# Patient Record
Sex: Male | Born: 1985 | Race: Black or African American | Hispanic: No | Marital: Single | Smoking: Never smoker
Health system: Southern US, Community
[De-identification: ages and names within clinical notes are randomized; demographics above are authoritative.]

## PROBLEM LIST (undated history)

## (undated) DIAGNOSIS — J45909 Unspecified asthma, uncomplicated: Secondary | ICD-10-CM

## (undated) HISTORY — PX: APPENDECTOMY: SHX54

---
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
## 2007-05-20 ENCOUNTER — Observation Stay (HOSPITAL_COMMUNITY): Admission: EM | Admit: 2007-05-20 | Discharge: 2007-05-21 | Payer: Self-pay | Admitting: Emergency Medicine

## 2007-05-20 ENCOUNTER — Encounter (INDEPENDENT_AMBULATORY_CARE_PROVIDER_SITE_OTHER): Payer: Self-pay | Admitting: General Surgery

## 2007-08-28 ENCOUNTER — Emergency Department (HOSPITAL_COMMUNITY): Admission: EM | Admit: 2007-08-28 | Discharge: 2007-08-28 | Payer: Self-pay | Admitting: Emergency Medicine

## 2007-08-30 ENCOUNTER — Emergency Department (HOSPITAL_COMMUNITY): Admission: EM | Admit: 2007-08-30 | Discharge: 2007-08-30 | Payer: Self-pay | Admitting: Emergency Medicine

## 2009-02-03 ENCOUNTER — Emergency Department (HOSPITAL_COMMUNITY): Admission: EM | Admit: 2009-02-03 | Discharge: 2009-02-04 | Payer: Self-pay | Admitting: Emergency Medicine

## 2009-04-26 ENCOUNTER — Emergency Department (HOSPITAL_COMMUNITY): Admission: EM | Admit: 2009-04-26 | Discharge: 2009-04-26 | Payer: Self-pay | Admitting: Emergency Medicine

## 2009-08-03 ENCOUNTER — Emergency Department (HOSPITAL_COMMUNITY): Admission: EM | Admit: 2009-08-03 | Discharge: 2009-08-03 | Payer: Self-pay | Admitting: Emergency Medicine

## 2010-01-02 ENCOUNTER — Emergency Department (HOSPITAL_COMMUNITY)
Admission: EM | Admit: 2010-01-02 | Discharge: 2010-01-02 | Payer: Self-pay | Source: Home / Self Care | Admitting: Emergency Medicine

## 2010-03-28 LAB — URINALYSIS, ROUTINE W REFLEX MICROSCOPIC
Bilirubin Urine: NEGATIVE
Hgb urine dipstick: NEGATIVE
Ketones, ur: NEGATIVE mg/dL
Specific Gravity, Urine: 1.031 — ABNORMAL HIGH (ref 1.005–1.030)
pH: 5.5 (ref 5.0–8.0)

## 2010-05-23 NOTE — Op Note (Signed)
NAMEHUNNER, GARCON NO.:  [id number]   MEDICAL RECORD NO.:  [id number]          PATIENT TYPE:  OBV   LOCATION:  0098                         FACILITY:  [facility]   PHYSICIAN:  Ollen Gross. Vernell Morgans, M.D. DATE OF BIRTH:  [birth year]   DATE OF PROCEDURE:  05/20/2007  DATE OF DISCHARGE:                               OPERATIVE REPORT   PREOPERATIVE DIAGNOSIS:  Appendicitis.   POSTOPERATIVE DIAGNOSIS:  Appendicitis.   PROCEDURE:  Laparoscopic appendectomy.   SURGEON:  Dr. Carolynne Edouard.   ANESTHESIA:  General endotracheal.   PROCEDURE:  After informed consent was obtained, the patient was brought  to the operating room and placed in a supine position on the operating  room table.  After induction of general anesthesia, the patient's  abdomen was prepped with Betadine and draped in usual sterile manner.  The area below the umbilicus was infiltrated with 0.25% Marcaine.  A  small incision was made with a 15 blade knife.  This incision was  carried through the subcutaneous tissue bluntly with a hemostat and [employer] retractors until the linea alba was identified.  The linea alba was  incised with a 15 blade knife.  Each side was grasped Kocher clamps and  elevated anteriorly.  The preperitoneal space was then probed bluntly  with a hemostat until the peritoneum was opened, and access was gained  to the abdominal cavity.  A 0 Vicryl pursestring stitch was placed in  the fascia around the opening.  Hasson cannula was placed through the  opening and anchored in place with a previously placed Vicryl  pursestring stitch.  The abdomen was then insufflated with carbon  dioxide without difficulty.  The patient was placed in Trendelenburg  position, rotated with the right side up.  Next, suprapubic region was  infiltrated with 0.25% Marcaine.  A small incision was made with a 15  blade knife, and a 10-mm port was placed bluntly through this incision  and into the abdominal cavity under  direct vision.  Between the two  ports, another site was chosen for placement a 5-mm port.  This area was  infiltrated with 0.25% Marcaine.  A small stab incision was made with a  15 blade knife, and a 5-mm port was placed bluntly through this incision  into the abdominal cavity under direct vision without difficulty.  The  laparoscope was then moved to the suprapubic port using a Glassman  grasper and harmonic scalpel.  The right lower quadrant was examined.  The patient had an enlarged, inflamed appendix that was readily  identified.  There was some attachment of the appendix to the  retroperitoneum which was taken down sharply with the harmonic scalpel.  Once this was done, the appendix was able to be elevated towards the  anterior abdominal wall.  The rest of the mesoappendix was taken down  sharply with the electrocautery.  Once this was accomplished, the base  of the appendix at its junction with the cecum was well identified and  cleared of any tissue.  A laparoscopic GIA blue load 6 row stapler was  then  placed through the [facility] cannula, across the base of the appendix,  was clamped and fired thereby dividing the base the appendix between  staple lines.  A laparoscopic bag was inserted through the [facility]  cannula.  The appendix was placed in the bag, and the bag was sealed.  A  couple of clips were used to control some bleeding from the staple line.  Once this was done, the staple line was completely hemostatic.  Staple  line was healthy and looked good and intact.  The abdomen was then  irrigated with copious amounts of saline.  The appendix with the bag was  then removed through the infraumbilical port without difficulty.  The  fascial defect was closed with the previously placed Vicryl pursestring  stitch as well as another figure-of-eight 0 Vicryl stitch.  The rest of  ports were removed under direct vision.  The gas was allowed to escape.  The fascia of the suprapubic port  was also  closed with an interrupted 0 Vicryl stitch.  Skin incisions were all  closed with interrupted 4-0 Monocryl subcuticular stitches.  Dermabond  dressings were applied.  The patient tolerated well.  At the end of the  case, all needle, sponge and instrument counts were correct.  The  patient was awakened and taken to recovery in stable condition.      Ollen Gross. Vernell Morgans, M.D.  Electronically Signed     PST/MEDQ  D:  05/20/2007  T:  05/20/2007  Job:  [id number]

## 2010-05-23 NOTE — H&P (Signed)
NAMEUSTIN, CRUICKSHANK NO.:  [id number]   MEDICAL RECORD NO.:  [id number]          PATIENT TYPE:  EMS   LOCATION:  ED                           FACILITY:  [facility]   PHYSICIAN:  Ollen Gross. Vernell Morgans, M.D. DATE OF BIRTH:  1985/06/28   DATE OF ADMISSION:  05/20/2007  DATE OF DISCHARGE:                              HISTORY & PHYSICAL   Mr. Delamater is a 25 year old black male who initially developed right  lower quadrant pain yesterday.  He was unable to sleep last night, and  the pain worsened.  The pain was associated with some nausea and  vomiting this morning.  He denies any fevers or chills.  No chest pain,  shortness of breath.  No diarrhea or dysuria.   REVIEW OF SYSTEMS:  His other review of systems are unremarkable.   PAST MEDICAL HISTORY:  Significant for asthma.   PAST SURGICAL HISTORY:  None.   MEDICATIONS:  None.   ALLERGIES:  NONE.   SOCIAL HISTORY:  He denies use of alcohol or tobacco products.   FAMILY HISTORY:  Noncontributory.   PHYSICAL EXAMINATION:  VITAL SIGNS:  Temperature is 99.2, blood pressure  104/55, pulse of 89.  GENERAL:  He is a well-developed, well-nourished black male in no acute  distress.  SKIN:  Warm and dry.  No jaundice.  HEENT:  Extraocular muscles are intact.  Pupils are equal, round, and  reactive to light.  Sclerae nonicteric.  LUNGS:  Clear and unlabored without any use of accessory respiratory  muscles.  HEART:  Regular rate and rhythm with an impulse in the left chest.  ABDOMEN:  Soft but focally tender in the right lower quadrant without  peritonitis.  No palpable mass or hepatosplenomegaly.  EXTREMITIES:  No cyanosis, clubbing, or edema.  Good strength in his  arms and legs.  PSYCHOLOGIC:  He is alert and oriented x3 with no evidence of anxiety or  depression.   LABORATORY DATA:  Significant for normal white count.   On review of his CT scan, he did have an enlarged inflamed appendix.   ASSESSMENT AND PLAN:   This is a 25 year old black male with what appears  to be acute appendicitis.  Because of the risk of perforation and  sepsis, I think he would benefit from having his appendix removed.  He  would also like  to have this done.  I have explained to him in detail the risks and  benefits, the operation to remove the appendix as well as some of  technical aspects, and he understands and wishes to proceed.  We will  obtain some routine preoperative lab work in preparation for doing this  for him today.      Ollen Gross. Vernell Morgans, M.D.  Electronically Signed     PST/MEDQ  D:  05/20/2007  T:  05/20/2007  Job:  [id number]

## 2010-10-04 LAB — URINE MICROSCOPIC-ADD ON

## 2010-10-04 LAB — BASIC METABOLIC PANEL
BUN: 6
CO2: 27
Calcium: 9.1
Chloride: 108
Creatinine, Ser: 0.85
GFR calc non Af Amer: >60
Potassium: 4

## 2010-10-04 LAB — DIFFERENTIAL
Basophils Relative: 1
Monocytes Absolute: 0.5
Monocytes Relative: 6
Neutro Abs: 7.1

## 2010-10-04 LAB — CBC
HCT: 39.5
Hemoglobin: 13.3
Platelets: 192
RBC: 4.76

## 2010-10-04 LAB — URINALYSIS, ROUTINE W REFLEX MICROSCOPIC: Nitrite: NEGATIVE

## 2011-10-23 ENCOUNTER — Encounter (HOSPITAL_COMMUNITY): Payer: Self-pay | Admitting: *Deleted

## 2011-10-23 ENCOUNTER — Emergency Department (HOSPITAL_COMMUNITY)
Admission: EM | Admit: 2011-10-23 | Discharge: 2011-10-23 | Payer: Self-pay | Attending: Emergency Medicine | Admitting: Emergency Medicine

## 2011-10-23 ENCOUNTER — Emergency Department (HOSPITAL_COMMUNITY)
Admission: EM | Admit: 2011-10-23 | Discharge: 2011-10-23 | Disposition: A | Discharge status: Home / Self Care | Payer: Self-pay | Attending: Emergency Medicine | Admitting: Emergency Medicine

## 2011-10-23 DIAGNOSIS — S0180XA Unspecified open wound of other part of head, initial encounter: Secondary | ICD-10-CM | POA: Insufficient documentation

## 2011-10-23 DIAGNOSIS — Z23 Encounter for immunization: Secondary | ICD-10-CM | POA: Insufficient documentation

## 2011-10-23 DIAGNOSIS — Z0389 Encounter for observation for other suspected diseases and conditions ruled out: Secondary | ICD-10-CM | POA: Insufficient documentation

## 2011-10-23 MED ORDER — TETANUS-DIPHTH-ACELL PERTUSSIS 5-2.5-18.5 LF-MCG/0.5 IM SUSP
0.5000 mL | Freq: Once | INTRAMUSCULAR | Status: AC
  Administered 2011-10-23: 0.5 mL via INTRAMUSCULAR
  Filled 2011-10-23: qty 0.5

## 2011-10-23 MED ORDER — IBUPROFEN 800 MG PO TABS: 800.0000 mg | ORAL_TABLET | Freq: Three times a day (TID) | ORAL | Status: DC | PRN

## 2011-10-23 MED ORDER — AMOXICILLIN-POT CLAVULANATE 875-125 MG PO TABS: 1.0000 | ORAL_TABLET | Freq: Two times a day (BID) | ORAL | Status: DC

## 2011-10-23 NOTE — ED Notes (Signed)
Pt called 3 x for triage. No answer 
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
## 2011-10-25 ENCOUNTER — Emergency Department (HOSPITAL_COMMUNITY)
Admission: EM | Admit: 2011-10-25 | Discharge: 2011-10-25 | Disposition: A | Discharge status: Home / Self Care | Payer: Self-pay | Attending: Emergency Medicine | Admitting: Emergency Medicine

## 2011-10-25 ENCOUNTER — Encounter (HOSPITAL_COMMUNITY): Payer: Self-pay | Admitting: Emergency Medicine

## 2011-10-25 DIAGNOSIS — S0003XA Contusion of scalp, initial encounter: Secondary | ICD-10-CM | POA: Insufficient documentation

## 2011-10-25 DIAGNOSIS — R220 Localized swelling, mass and lump, head: Secondary | ICD-10-CM

## 2011-10-25 DIAGNOSIS — W503XXA Accidental bite by another person, initial encounter: Secondary | ICD-10-CM

## 2011-10-25 DIAGNOSIS — S1093XA Contusion of unspecified part of neck, initial encounter: Secondary | ICD-10-CM | POA: Insufficient documentation

## 2011-10-25 DIAGNOSIS — R221 Localized swelling, mass and lump, neck: Secondary | ICD-10-CM | POA: Insufficient documentation
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
## 2011-10-27 NOTE — ED Provider Notes (Signed)
Medical screening examination/treatment/procedure(s) were performed by non-physician practitioner and as supervising physician I was immediately available for consultation/collaboration.   Hurman Horn, MD 10/27/11 1538

## 2012-06-14 ENCOUNTER — Encounter (HOSPITAL_COMMUNITY): Payer: Self-pay | Admitting: *Deleted

## 2012-06-14 ENCOUNTER — Emergency Department (HOSPITAL_COMMUNITY): Payer: Self-pay

## 2012-06-14 ENCOUNTER — Emergency Department (HOSPITAL_COMMUNITY)
Admission: EM | Admit: 2012-06-14 | Discharge: 2012-06-15 | Disposition: A | Discharge status: Home / Self Care | Payer: Self-pay | Attending: Emergency Medicine | Admitting: Emergency Medicine

## 2012-06-14 DIAGNOSIS — S61409A Unspecified open wound of unspecified hand, initial encounter: Secondary | ICD-10-CM | POA: Insufficient documentation

## 2012-06-14 DIAGNOSIS — S6990XA Unspecified injury of unspecified wrist, hand and finger(s), initial encounter: Secondary | ICD-10-CM | POA: Insufficient documentation

## 2012-06-14 DIAGNOSIS — S6991XA Unspecified injury of right wrist, hand and finger(s), initial encounter: Secondary | ICD-10-CM

## 2012-06-14 MED ORDER — SODIUM CHLORIDE 0.9 % IV SOLN
3.0000 g | Freq: Once | INTRAVENOUS | Status: AC
  Administered 2012-06-15: 3 g via INTRAVENOUS
  Filled 2012-06-14: qty 3
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
## 2012-06-15 LAB — CBC WITH DIFFERENTIAL/PLATELET
Eosinophils Absolute: 0.1 10*3/uL (ref 0.0–0.7)
Eosinophils Relative: 2 % (ref 0–5)
HCT: 40.3 % (ref 39.0–52.0)
MCH: 27.5 pg (ref 26.0–34.0)
MCHC: 33.3 g/dL (ref 30.0–36.0)
MCV: 82.8 fL (ref 78.0–100.0)
Monocytes Absolute: 0.5 10*3/uL (ref 0.1–1.0)
Platelets: 205 10*3/uL (ref 150–400)

## 2012-06-15 LAB — POCT I-STAT, CHEM 8
Creatinine, Ser: 0.9 mg/dL (ref 0.50–1.35)
HCT: 45 % (ref 39.0–52.0)
Potassium: 3.8 mEq/L (ref 3.5–5.1)

## 2012-06-15 LAB — SEDIMENTATION RATE: Sed Rate: 3 mm/hr (ref 0–16)

## 2012-06-15 MED ORDER — AMOXICILLIN-POT CLAVULANATE 875-125 MG PO TABS: 1.0000 | ORAL_TABLET | Freq: Two times a day (BID) | ORAL | Status: DC

## 2012-06-15 MED ORDER — OXYCODONE-ACETAMINOPHEN 5-325 MG PO TABS: 2.0000 | ORAL_TABLET | ORAL | Status: DC | PRN

## 2012-06-15 NOTE — ED Notes (Signed)
Patient is alert and oriented x3.  He was given DC instructions and follow up visit instructions.  Patient gave verbal understanding.  He was DC ambulatory under his own power to home.  V/S stable.  He was not showing any signs of distress on DC 

## 2013-11-11 ENCOUNTER — Emergency Department (HOSPITAL_COMMUNITY)
Admission: EM | Admit: 2013-11-11 | Discharge: 2013-11-11 | Disposition: A | Discharge status: Home / Self Care | Payer: Self-pay | Attending: Emergency Medicine | Admitting: Emergency Medicine

## 2013-11-11 ENCOUNTER — Encounter (HOSPITAL_COMMUNITY): Payer: Self-pay | Admitting: *Deleted

## 2013-11-11 DIAGNOSIS — Z202 Contact with and (suspected) exposure to infections with a predominantly sexual mode of transmission: Secondary | ICD-10-CM | POA: Insufficient documentation

## 2013-11-11 LAB — URINALYSIS, ROUTINE W REFLEX MICROSCOPIC
Bilirubin Urine: NEGATIVE
Glucose, UA: NEGATIVE mg/dL
Hgb urine dipstick: NEGATIVE
Ketones, ur: NEGATIVE mg/dL
LEUKOCYTES UA: NEGATIVE
Nitrite: NEGATIVE
PROTEIN: NEGATIVE mg/dL
Specific Gravity, Urine: 1.029 (ref 1.005–1.030)
UROBILINOGEN UA: 1 mg/dL (ref 0.0–1.0)
pH: 7 (ref 5.0–8.0)

## 2013-11-11 MED ORDER — AZITHROMYCIN 250 MG PO TABS
1000.0000 mg | ORAL_TABLET | Freq: Once | ORAL | Status: AC
  Administered 2013-11-11: 1000 mg via ORAL
  Filled 2013-11-11: qty 4

## 2013-11-11 MED ORDER — METRONIDAZOLE 500 MG PO TABS: 500.0000 mg | ORAL_TABLET | Freq: Two times a day (BID) | ORAL | Status: DC

## 2013-11-11 MED ORDER — STERILE WATER FOR INJECTION IJ SOLN
INTRAMUSCULAR | Status: AC
  Administered 2013-11-11: 10 mL
  Filled 2013-11-11: qty 10

## 2013-11-11 MED ORDER — CEFTRIAXONE SODIUM 250 MG IJ SOLR
250.0000 mg | Freq: Once | INTRAMUSCULAR | Status: AC
Start: 2013-11-11 — End: 2013-11-11
  Administered 2013-11-11: 250 mg via INTRAMUSCULAR
  Filled 2013-11-11: qty 250

## 2013-11-11 NOTE — Discharge Instructions (Signed)
You were treated today for both gonorrhea and Chlamydia. If these tests results positive, you'll be contacted and are then obligated to inform your partner. It is also important to take Flagyl twice daily for the next 7 days for exposure to trichomonas.  Trichomoniasis Trichomoniasis is an infection caused by an organism called Trichomonas. The infection can affect both women and men. In women, the outer male genitalia and the vagina are affected. In men, the penis is mainly affected, but the prostate and other reproductive organs can also be involved. Trichomoniasis is a sexually transmitted infection (STI) and is most often passed to another person through sexual contact.  RISK FACTORS  Having unprotected sexual intercourse.  Having sexual intercourse with an infected partner. SIGNS AND SYMPTOMS  Symptoms of trichomoniasis in women include:  Abnormal gray-green frothy vaginal discharge.  Itching and irritation of the vagina.  Itching and irritation of the area outside the vagina. Symptoms of trichomoniasis in men include:   Penile discharge with or without pain.  Pain during urination. This results from inflammation of the urethra. DIAGNOSIS  Trichomoniasis may be found during a Pap test or physical exam. Your health care provider may use one of the following methods to help diagnose this infection:  Examining vaginal discharge under a microscope. For men, urethral discharge would be examined.  Testing the pH of the vagina with a test tape.  Using a vaginal swab test that checks for the Trichomonas organism. A test is available that provides results within a few minutes.  Doing a culture test for the organism. This is not usually needed. TREATMENT   You may be given medicine to fight the infection. Women should inform their health care provider if they could be or are pregnant. Some medicines used to treat the infection should not be taken during pregnancy.  Your health care  provider may recommend over-the-counter medicines or creams to decrease itching or irritation.  Your sexual partner will need to be treated if infected. HOME CARE INSTRUCTIONS   Take medicines only as directed by your health care provider.  Take over-the-counter medicine for itching or irritation as directed by your health care provider.  Do not have sexual intercourse while you have the infection.  Women should not douche or wear tampons while they have the infection.  Discuss your infection with your partner. Your partner may have gotten the infection from you, or you may have gotten it from your partner.  Have your sex partner get examined and treated if necessary.  Practice safe, informed, and protected sex.  See your health care provider for other STI testing. SEEK MEDICAL CARE IF:   You still have symptoms after you finish your medicine.  You develop abdominal pain.  You have pain when you urinate.  You have bleeding after sexual intercourse.  You develop a rash.  Your medicine makes you sick or makes you throw up (vomit). MAKE SURE YOU:  Understand these instructions.  Will watch your condition.  Will get help right away if you are not doing well or get worse. Document Released: 06/20/2000 Document Revised: 05/11/2013 Document Reviewed: 10/06/2012 [facility] Patient Information 2015 [place], [place]. This information is not intended to replace advice given to you by your health care provider. Make sure you discuss any questions you have with your health care provider.  Sexually Transmitted Disease A sexually transmitted disease (STD) is a disease or infection that may be passed (transmitted) from person to person, usually during sexual activity. This may happen  by way of saliva, semen, blood, vaginal mucus, or urine. Common STDs include:   Gonorrhea.   Chlamydia.   Syphilis.   HIV and AIDS.   Genital herpes.   Hepatitis B and C.   Trichomonas.    Human papillomavirus (HPV).   Pubic lice.   Scabies.  Mites.  Bacterial vaginosis. WHAT ARE CAUSES OF STDs? An STD may be caused by bacteria, a virus, or parasites. STDs are often transmitted during sexual activity if one person is infected. However, they may also be transmitted through nonsexual means. STDs may be transmitted after:   Sexual intercourse with an infected person.   Sharing sex toys with an infected person.   Sharing needles with an infected person or using unclean piercing or tattoo needles.  Having intimate contact with the genitals, mouth, or rectal areas of an infected person.   Exposure to infected fluids during birth. WHAT ARE THE SIGNS AND SYMPTOMS OF STDs? Different STDs have different symptoms. Some people may not have any symptoms. If symptoms are present, they may include:   Painful or bloody urination.   Pain in the pelvis, abdomen, vagina, anus, throat, or eyes.   A skin rash, itching, or irritation.  Growths, ulcerations, blisters, or sores in the genital and anal areas.  Abnormal vaginal discharge with or without bad odor.   Penile discharge in men.   Fever.   Pain or bleeding during sexual intercourse.   Swollen glands in the groin area.   Yellow skin and eyes (jaundice). This is seen with hepatitis.   Swollen testicles.  Infertility.  Sores and blisters in the mouth. HOW ARE STDs DIAGNOSED? To make a diagnosis, your health care provider may:   Take a medical history.   Perform a physical exam.   Take a sample of any discharge to examine.  Swab the throat, cervix, opening to the penis, rectum, or vagina for testing.  Test a sample of your first morning urine.   Perform blood tests.   Perform a Pap test, if this applies.   Perform a colposcopy.   Perform a laparoscopy.  HOW ARE STDs TREATED? Treatment depends on the STD. Some STDs may be treated but not cured.   Chlamydia, gonorrhea,  trichomonas, and syphilis can be cured with antibiotic medicine.   Genital herpes, hepatitis, and HIV can be treated, but not cured, with prescribed medicines. The medicines lessen symptoms.   Genital warts from HPV can be treated with medicine or by freezing, burning (electrocautery), or surgery. Warts may come back.   HPV cannot be cured with medicine or surgery. However, abnormal areas may be removed from the cervix, vagina, or vulva.   If your diagnosis is confirmed, your recent sexual partners need treatment. This is true even if they are symptom-free or have a negative culture or evaluation. They should not have sex until their health care providers say it is okay. HOW CAN I REDUCE MY RISK OF GETTING AN STD? Take these steps to reduce your risk of getting an STD:  Use latex condoms, dental dams, and water-soluble lubricants during sexual activity. Do not use petroleum jelly or oils.  Avoid having multiple sex partners.  Do not have sex with someone who has other sex partners.  Do not have sex with anyone you do not know or who is at high risk for an STD.  Avoid risky sex practices that can break your skin.  Do not have sex if you have open sores on your mouth  or skin.  Avoid drinking too much alcohol or taking illegal drugs. Alcohol and drugs can affect your judgment and put you in a vulnerable position.  Avoid engaging in oral and anal sex acts.  Get vaccinated for HPV and hepatitis. If you have not received these vaccines in the past, talk to your health care provider about whether one or both might be right for you.   If you are at risk of being infected with HIV, it is recommended that you take a prescription medicine daily to prevent HIV infection. This is called pre-exposure prophylaxis (PrEP). You are considered at risk if:  You are a man who has sex with other men (MSM).  You are a heterosexual man or woman and are sexually active with more than one  partner.  You take drugs by injection.  You are sexually active with a partner who has HIV.  Talk with your health care provider about whether you are at high risk of being infected with HIV. If you choose to begin PrEP, you should first be tested for HIV. You should then be tested every 3 months for as long as you are taking PrEP.  WHAT SHOULD I DO IF I THINK I HAVE AN STD?  See your health care provider.   Tell your sexual partner(s). They should be tested and treated for any STDs.  Do not have sex until your health care provider says it is okay. WHEN SHOULD I GET IMMEDIATE MEDICAL CARE? Contact your health care provider right away if:   You have severe abdominal pain.  You are a man and notice swelling or pain in your testicles.  You are a woman and notice swelling or pain in your vagina. Document Released: 03/17/2002 Document Revised: 12/30/2012 Document Reviewed: 07/15/2012 [facility]Exit[facility] Patient Information 2015 [place], [place]LL[place]. This information is not intended to replace advice given to you by your health care provider. Make sure you discuss any questions you have with your health care provider.

## 2013-11-11 NOTE — ED Notes (Signed)
Pt reports his gf was tested today and was told she had tricc, pt wants to be tested. Denies pain.

## 2013-11-11 NOTE — ED Provider Notes (Signed)
CSN: [id number]     Arrival date & time 11/11/13  1827 History   None    Chief Complaint  Patient presents with  . SEXUALLY TRANSMITTED DISEASE   The history is provided by the patient. No language interpreter was used.   This chart was scribed for non-physician practitioner, Celene Skeenobyn Ryder Man PA-C working with Ethelda ChickMartha K Linker, MD, by Andrew Auaven Small, ED Scribe. This patient was seen in room WTR8/WTR8 and the patient's care was started at 7:25 PM.  John Mckee is a 28 y.o. male who presents to the Emergency Department for an STD screen. Pt reports his girlfriend was diagnosed with trichomonas today and is now requesting an STD check. Pt denies penile discharge and dysuria. Pt denies hx of STD.  History reviewed. No pertinent past medical history. History reviewed. No pertinent past surgical history. History reviewed. No pertinent family history.  Review of Systems  Constitutional: Negative for fever and chills.  Genitourinary: Negative for dysuria, discharge, difficulty urinating and penile pain.  10 Systems reviewed and are negative for acute change except as noted in the HPI.  Allergies  Review of patient's allergies indicates no known allergies.  Home Medications   Prior to Admission medications   Medication Sig Start Date End Date Taking? Authorizing Provider  amoxicillin-clavulanate (AUGMENTIN) 875-125 MG per tablet Take 1 tablet by mouth every 12 (twelve) hours. 06/15/12   Olivia Mackielga M Otter, MD  loratadine (CLARITIN) 10 MG tablet Take 10 mg by mouth daily as needed for allergies.    Historical Provider, MD  metroNIDAZOLE (FLAGYL) 500 MG tablet Take 1 tablet (500 mg total) by mouth 2 (two) times daily. One po bid x 7 days 11/11/13   Kathrynn Speedobyn M Jerimah Witucki, PA-C  oxyCODONE-acetaminophen (PERCOCET/ROXICET) 5-325 MG per tablet Take 2 tablets by mouth every 4 (four) hours as needed for pain. 06/15/12   Olivia Mackielga M Otter, MD   BP 101/71 mmHg  Pulse 63  Temp(Src) 98.2 F (36.8 C) (Oral)  Resp 16  SpO2  100% Physical Exam  Constitutional: He is oriented to person, place, and time. He appears well-developed and well-nourished. No distress.  HENT:  Head: Normocephalic and atraumatic.  Eyes: Conjunctivae and EOM are normal.  Neck: Neck supple.  Cardiovascular: Normal rate.   Pulmonary/Chest: Effort normal.  Musculoskeletal: Normal range of motion.  Neurological: He is alert and oriented to person, place, and time.  Skin: Skin is warm and dry.  Psychiatric: He has a normal mood and affect. His behavior is normal.  Nursing note and vitals reviewed.  ED Course  Procedures (including critical care time) DIAGNOSTIC STUDIES: Oxygen Saturation is 100% on RA, normal by my interpretation.    COORDINATION OF CARE: 7:25 PM- Pt advised of plan for treatment and pt agrees.  Labs Review Labs Reviewed  GC/CHLAMYDIA PROBE AMP  URINALYSIS, ROUTINE W REFLEX MICROSCOPIC    Imaging Review No results found.   EKG Interpretation None      MDM   Final diagnoses:  Exposure to STD   Patient in no apparent distress. Afebrile, vital signs stable. Asymptomatic. GC/Chlamydia cultures pending. Exposure to trichomonas. Will treat with Flagyl. Prophylactically treated for GC/chlamydia with Rocephin and azithromycin. States sexual practices discussed. Stable for discharge. Return precautions given. Patient states understanding of treatment care plan and is agreeable.  I personally performed the services described in this documentation, which was scribed in my presence. The recorded information has been reviewed and is accurate.      Kathrynn SpeedRobyn M Jeannemarie Sawaya, PA-C 11/11/13  1926  Ethelda ChickMartha K Linker, MD 11/11/13 [phone]

## 2013-11-12 LAB — GC/CHLAMYDIA PROBE AMP
CT PROBE, AMP APTIMA: NEGATIVE
GC PROBE AMP APTIMA: NEGATIVE

## 2013-11-13 ENCOUNTER — Telehealth (HOSPITAL_BASED_OUTPATIENT_CLINIC_OR_DEPARTMENT_OTHER): Payer: Self-pay | Admitting: Emergency Medicine

## 2014-03-29 ENCOUNTER — Emergency Department (HOSPITAL_COMMUNITY)
Admission: EM | Admit: 2014-03-29 | Discharge: 2014-03-29 | Disposition: A | Discharge status: Home / Self Care | Payer: Self-pay | Attending: Emergency Medicine | Admitting: Emergency Medicine

## 2014-03-29 ENCOUNTER — Encounter (HOSPITAL_COMMUNITY): Payer: Self-pay | Admitting: Emergency Medicine

## 2014-03-29 DIAGNOSIS — Z79899 Other long term (current) drug therapy: Secondary | ICD-10-CM | POA: Insufficient documentation

## 2014-03-29 DIAGNOSIS — J069 Acute upper respiratory infection, unspecified: Secondary | ICD-10-CM | POA: Insufficient documentation

## 2014-03-29 DIAGNOSIS — Z792 Long term (current) use of antibiotics: Secondary | ICD-10-CM | POA: Insufficient documentation

## 2014-03-29 DIAGNOSIS — R05 Cough: Secondary | ICD-10-CM

## 2014-03-29 DIAGNOSIS — R059 Cough, unspecified: Secondary | ICD-10-CM

## 2014-03-29 NOTE — ED Notes (Signed)
Pt c/o dry cough and congestion x couple of days. Denies any pain at the moment only when coughing does his throat hurt. Denies n/v/d.

## 2014-03-29 NOTE — Discharge Instructions (Signed)
Continue to stay well-hydrated. Gargle warm salt water and spit it out. Continue to alternate between Tylenol and Ibuprofen for pain or fever. Use Mucinex for cough suppression/expectoration of mucus. Use netipot and flonase to help with nasal congestion. May consider over-the-counter Benadryl or other antihistamine to help with your symptoms including decrease secretions and for watery itchy eyes. Followup with [place] [facility] wellness in 5-7 days for recheck of ongoing symptoms. Return to emergency department for emergent changing or worsening of symptoms.   Cough, Adult  A cough is a reflex. It helps you clear your throat and airways. A cough can help heal your body. A cough can last 2 or 3 weeks (acute) or may last more than 8 weeks (chronic). Some common causes of a cough can include an infection, allergy, or a cold. HOME CARE  Only take medicine as told by your doctor.  If given, take your medicines (antibiotics) as told. Finish them even if you start to feel better.  Use a cold steam vaporizer or humidifier in your home. This can help loosen thick spit (secretions).  Sleep so you are almost sitting up (semi-upright). Use pillows to do this. This helps reduce coughing.  Rest as needed.  Stop smoking if you smoke. GET HELP RIGHT AWAY IF:  You have yellowish-white fluid (pus) in your thick spit.  Your cough gets worse.  Your medicine does not reduce coughing, and you are losing sleep.  You cough up blood.  You have trouble breathing.  Your pain gets worse and medicine does not help.  You have a fever. MAKE SURE YOU:   Understand these instructions.  Will watch your condition.  Will get help right away if you are not doing well or get worse. Document Released: 09/07/2010 Document Revised: 05/11/2013 Document Reviewed: 09/07/2010 [facility] Patient Information 2015 [place], [place]. This information is not intended to replace advice given to you by your health care provider.  Make sure you discuss any questions you have with your health care provider.  Upper Respiratory Infection, Adult An upper respiratory infection (URI) is also known as the common cold. It is often caused by a type of germ (virus). Colds are easily spread (contagious). You can pass it to others by kissing, coughing, sneezing, or drinking out of the same glass. Usually, you get better in 1 or 2 weeks.  HOME CARE   Only take medicine as told by your doctor.  Use a warm mist humidifier or breathe in steam from a hot shower.  Drink enough water and fluids to keep your pee (urine) clear or pale yellow.  Get plenty of rest.  Return to work when your temperature is back to normal or as told by your doctor. You may use a face mask and wash your hands to stop your cold from spreading. GET HELP RIGHT AWAY IF:   After the first few days, you feel you are getting worse.  You have questions about your medicine.  You have chills, shortness of breath, or brown or red spit (mucus).  You have yellow or brown snot (nasal discharge) or pain in the face, especially when you bend forward.  You have a fever, puffy (swollen) neck, pain when you swallow, or white spots in the back of your throat.  You have a bad headache, ear pain, sinus pain, or chest pain.  You have a high-pitched whistling sound when you breathe in and out (wheezing).  You have a lasting cough or cough up blood.  You  have sore muscles or a stiff neck. MAKE SURE YOU:   Understand these instructions.  Will watch your condition.  Will get help right away if you are not doing well or get worse. Document Released: 06/13/2007 Document Revised: 03/19/2011 Document Reviewed: 04/01/2013 [facility] Patient Information 2015 [place], [place]. This information is not intended to replace advice given to you by your health care provider. Make sure you discuss any questions you have with your health care provider.

## 2014-03-29 NOTE — ED Provider Notes (Signed)
CSN: [id number]     Arrival date & time 03/29/14  1048 History   First MD Initiated Contact with Patient 03/29/14 1114     Chief Complaint  Patient presents with  . Cough  . Nasal Congestion     (Consider location/radiation/quality/duration/timing/severity/associated sxs/prior Treatment) HPI Comments: John Mckee is a 29 y.o. Male with a PMHx of seasonal allergies, who presents to the ED with complaints of URI symptoms including a dry cough, intermittent wheezing, and intermittent shortness of breath with coughing. He also complains of chest congestion. Reports that the symptoms have been ongoing 3 days and has been unrelieved with DayQuil and NyQuil. No known aggravating factors. No known sick contacts, although reports seasonal allergies and is unsure if this may be the source of his symptoms. Denies any chest pain, fevers, chills, ear pain or drainage, rhinorrhea, sore throat, drooling, trismus, abdominal pain, nausea, vomiting, diarrhea, dysuria, hematuria, numbness, tingling, weakness, myalgias, arthralgias, or rashes. Patient is a nonsmoker with no recent travel.  Patient is a 29 y.o. male presenting with cough. The history is provided by the patient. No language interpreter was used.  Cough Cough characteristics:  Dry Severity:  Mild Onset quality:  Gradual Duration:  3 days Timing:  Intermittent Progression:  Unchanged Chronicity:  New Smoker: no   Context: weather changes   Context: not sick contacts   Relieved by:  Nothing Worsened by:  Nothing tried Ineffective treatments:  Cough suppressants and decongestant Associated symptoms: shortness of breath (intermittently with coughing) and wheezing (intermittent, subjective)   Associated symptoms: no chest pain, no chills, no ear fullness, no ear pain, no eye discharge, no fever, no myalgias, no rash, no rhinorrhea, no sinus congestion and no sore throat     History reviewed. No pertinent past medical history. History  reviewed. No pertinent past surgical history. No family history on file. History  Substance Use Topics  . Smoking status: Never Smoker   . Smokeless tobacco: Not on file  . Alcohol Use: Yes    Review of Systems  Constitutional: Negative for fever and chills.  HENT: Positive for congestion. Negative for drooling, ear discharge, ear pain, rhinorrhea, sinus pressure, sore throat and trouble swallowing.   Eyes: Negative for pain and discharge.  Respiratory: Positive for cough, shortness of breath (intermittently with coughing) and wheezing (intermittent, subjective).   Cardiovascular: Negative for chest pain.  Gastrointestinal: Negative for nausea, vomiting, abdominal pain and diarrhea.  Genitourinary: Negative for dysuria and hematuria.  Musculoskeletal: Negative for myalgias, arthralgias and neck pain.  Skin: Negative for color change and rash.  Allergic/Immunologic: Positive for environmental allergies.  Neurological: Negative for weakness and numbness.  Psychiatric/Behavioral: Negative for confusion.   10 Systems reviewed and are negative for acute change except as noted in the HPI.    Allergies  Review of patient's allergies indicates no known allergies.  Home Medications   Prior to Admission medications   Medication Sig Start Date End Date Taking? Authorizing Provider  amoxicillin-clavulanate (AUGMENTIN) 875-125 MG per tablet Take 1 tablet by mouth every 12 (twelve) hours. 06/15/12   Marisa Severinlga Otter, MD  loratadine (CLARITIN) 10 MG tablet Take 10 mg by mouth daily as needed for allergies.    Historical Provider, MD  metroNIDAZOLE (FLAGYL) 500 MG tablet Take 1 tablet (500 mg total) by mouth 2 (two) times daily. One po bid x 7 days 11/11/13   Kathrynn Speedobyn M Hess, PA-C  oxyCODONE-acetaminophen (PERCOCET/ROXICET) 5-325 MG per tablet Take 2 tablets by mouth every 4 (four) hours  as needed for pain. 06/15/12   Marisa Severin, MD   BP 129/69 mmHg  Pulse 85  Temp(Src) 98 F (36.7 C) (Oral)  Resp 16   SpO2 100% Physical Exam  Constitutional: He is oriented to person, place, and time. Vital signs are normal. He appears well-developed and well-nourished.  Non-toxic appearance. No distress.  Afebrile, nontoxic, NAD  HENT:  Head: Normocephalic and atraumatic.  Right Ear: Hearing, tympanic membrane, external ear and ear canal normal.  Left Ear: Hearing, tympanic membrane, external ear and ear canal normal.  Nose: Nose normal.  Mouth/Throat: Uvula is midline, oropharynx is clear and moist and mucous membranes are normal. No trismus in the jaw. No uvula swelling.  Ears clear bilaterally Nose clear Oropharynx clear and moist without tonsillar swelling or exudates, no erythema  Eyes: Conjunctivae and EOM are normal. Right eye exhibits no discharge. Left eye exhibits no discharge.  Neck: Normal range of motion. Neck supple.  Cardiovascular: Normal rate, regular rhythm, normal heart sounds and intact distal pulses.  Exam reveals no gallop and no friction rub.   No murmur heard. Pulmonary/Chest: Effort normal and breath sounds normal. No respiratory distress. He has no decreased breath sounds. He has no wheezes. He has no rhonchi. He has no rales.  CTAB in all lung fields, no w/r/r, no hypoxia or increased WOB, speaking in full sentences, SpO2 100% on RA   Abdominal: Soft. Normal appearance and bowel sounds are normal. He exhibits no distension. There is no tenderness. There is no rigidity, no rebound and no guarding.  Musculoskeletal: Normal range of motion.  Lymphadenopathy:    He has cervical adenopathy.  Shotty cervical LAD diffusely throughout, all which is nonTTP  Neurological: He is alert and oriented to person, place, and time. He has normal strength. No sensory deficit.  Skin: Skin is warm, dry and intact. No rash noted.  Psychiatric: He has a normal mood and affect.  Nursing note and vitals reviewed.   ED Course  Procedures (including critical care time) Labs Review Labs Reviewed -  No data to display  Imaging Review No results found.   EKG Interpretation None      MDM   Final diagnoses:  URI (upper respiratory infection)  Cough    29 y.o. male here with dry cough. Pt is afebrile with a clear lung exam. Likely viral URI vs allergic component. Doubt need for imaging due to pt being afebrile with nonproductive cough and clear lung exam. Pt is agreeable to symptomatic treatment with close follow up with PCP as needed but spoke at length about emergent changing or worsening of symptoms that should prompt return to ER. Pt voices understanding and is agreeable to plan. Will refer to [place] and wellness. Stable at time of discharge.   BP 129/69 mmHg  Pulse 85  Temp(Src) 98 F (36.7 C) (Oral)  Resp 16  SpO2 100%'  Titania Gault Camprubi-Soms, PA-C 03/29/14 1144  Purvis Sheffield, MD 03/29/14 2117

## 2015-08-14 ENCOUNTER — Emergency Department (HOSPITAL_COMMUNITY)
Admission: EM | Admit: 2015-08-14 | Discharge: 2015-08-14 | Disposition: A | Discharge status: Home / Self Care | Payer: Self-pay | Attending: Emergency Medicine | Admitting: Emergency Medicine

## 2015-08-14 ENCOUNTER — Encounter (HOSPITAL_COMMUNITY): Payer: Self-pay | Admitting: *Deleted

## 2015-08-14 DIAGNOSIS — Y999 Unspecified external cause status: Secondary | ICD-10-CM | POA: Insufficient documentation

## 2015-08-14 DIAGNOSIS — Y9389 Activity, other specified: Secondary | ICD-10-CM | POA: Insufficient documentation

## 2015-08-14 DIAGNOSIS — Y929 Unspecified place or not applicable: Secondary | ICD-10-CM | POA: Insufficient documentation

## 2015-08-14 DIAGNOSIS — S61412A Laceration without foreign body of left hand, initial encounter: Secondary | ICD-10-CM | POA: Insufficient documentation

## 2015-08-14 DIAGNOSIS — W260XXA Contact with knife, initial encounter: Secondary | ICD-10-CM | POA: Insufficient documentation

## 2015-08-14 MED ORDER — LIDOCAINE HCL (PF) 1 % IJ SOLN
5.0000 mL | Freq: Once | INTRAMUSCULAR | Status: DC
  Filled 2015-08-14: qty 5

## 2015-08-14 MED ORDER — LIDOCAINE HCL 1 % IJ SOLN
INTRAMUSCULAR | Status: AC
  Filled 2015-08-14: qty 20

## 2015-08-14 NOTE — ED Provider Notes (Signed)
[facility]-EMERGENCY DEPT Provider Note   CSN: [id number] Arrival date & time: 08/14/15  1732  First Provider Contact:   First MD Initiated Contact with Patient 08/14/15 1821      By signing my name below, I, Freida Busmaniana Omoyeni, attest that this documentation has been prepared under the direction and in the presence of non-physician practitioner, Trixie DredgeEmily Jveon Pound, PA-C. Electronically Signed: Freida Busmaniana Omoyeni, Scribe. 08/14/2015. 6:28 PM.  History   Chief Complaint Chief Complaint  Patient presents with  . Extremity Laceration    The history is provided by the patient. No language interpreter was used.     HPI Comments:  John Primuslvis F Mckee is a [age] y.o. male who presents to the Emergency Department complaining of a laceration to the right hand which he sustained this evening. Pt was using a knife to pry open a can of spam when the knife slipped and cut  His hand. He reports mild-moderate associated pain to the site. Tetanus is UTD within the last 2 years. Pt is right hand dominant. Pt has no other complaints or symptoms at this time.  Denies weakness or numbness of hand, no other injury.    History reviewed. No pertinent past medical history.  There are no active problems to display for this patient.   History reviewed. No pertinent surgical history.    Home Medications    Prior to Admission medications   Medication Sig Start Date End Date Taking? Authorizing Provider  amoxicillin-clavulanate (AUGMENTIN) 875-125 MG per tablet Take 1 tablet by mouth every 12 (twelve) hours. 06/15/12   Marisa Severinlga Otter, MD  loratadine (CLARITIN) 10 MG tablet Take 10 mg by mouth daily as needed for allergies.    Historical Provider, MD  metroNIDAZOLE (FLAGYL) 500 MG tablet Take 1 tablet (500 mg total) by mouth 2 (two) times daily. One po bid x 7 days 11/11/13   Kathrynn Speedobyn M Hess, PA-C  oxyCODONE-acetaminophen (PERCOCET/ROXICET) 5-325 MG per tablet Take 2 tablets by mouth every 4 (four) hours as needed for pain. 06/15/12   Marisa Severinlga Otter, MD      Family History No family history on file.  Social History Social History  Substance Use Topics  . Smoking status: Never Smoker  . Smokeless tobacco: Never Used  . Alcohol use Yes     Allergies   Review of patient's allergies indicates no known allergies.   Review of Systems Review of Systems  Constitutional: Negative for fever.  Musculoskeletal: Negative for arthralgias.  Skin: Positive for wound.  Allergic/Immunologic: Negative for immunocompromised state.  Neurological: Negative for weakness and numbness.  Hematological: Does not bruise/bleed easily.  Psychiatric/Behavioral: Positive for self-injury (accidental ).    Physical Exam Updated Vital Signs BP 109/58 (BP Location: Right Arm)   Pulse 82   Temp 98.6 F (37 C) (Oral)   Resp 16   SpO2 98%   Physical Exam  Constitutional: He appears well-developed and well-nourished. No distress.  HENT:  Head: Normocephalic and atraumatic.  Neck: Neck supple.  Pulmonary/Chest: Effort normal.  Neurological: He is alert.  Skin: He is not diaphoretic.  3 cm laceration over the dorsal 2nd MCP on the left; hemostatic  Full active range of motion of all digits, strength 5/5, sensation intact, capillary refill < 2 seconds.   Nursing note and vitals reviewed.   ED Treatments / Results  DIAGNOSTIC STUDIES:  Oxygen Saturation is 98% on RA, normal by my interpretation.    COORDINATION OF CARE:  6:28 PM Discussed treatment plan with pt at bedside and  pt agreed to plan.  Labs (all labs ordered are listed, but only abnormal results are displayed) Labs Reviewed - No data to display  EKG  EKG Interpretation None       Radiology No results found.  Procedures .Marland KitchenLaceration Repair Date/Time: 08/14/2015 6:45 PM Performed by: Trixie Dredge Authorized by: Trixie Dredge   Consent:    Consent obtained:  Verbal   Consent given by:  Patient Anesthesia (see MAR for exact dosages):    Anesthesia method:  Local  infiltration   Local anesthetic:  Lidocaine 1% WITH epi Laceration details:    Location:  Hand   Hand location:  L hand, dorsum   Length (cm):  3 Repair type:    Repair type:  Simple Exploration:    Contaminated: no   Treatment:    Area cleansed with:  Saline   Visualized foreign bodies/material removed: no   Skin repair:    Repair method:  Sutures   Suture size:  4-0   Wound skin closure material used: Vicryl.   Number of sutures:  6 Post-procedure details:    Dressing:  Non-adherent dressing   Patient tolerance of procedure:  Tolerated well, no immediate complications      Medications Ordered in ED Medications  lidocaine (PF) (XYLOCAINE) 1 % injection 5 mL (not administered)  lidocaine (XYLOCAINE) 1 % (with pres) injection (not administered)     Initial Impression / Assessment and Plan / ED Course  I have reviewed the triage vital signs and the nursing notes.  Pertinent labs & imaging results that were available during my care of the patient were reviewed by me and considered in my medical decision making (see chart for details).  Clinical Course      Final Clinical Impressions(s) / ED Diagnoses   Afebrile nontoxic patient with laceration to dorsal 2nd MCP.  Neurovascularly intact.  No tendon injury. Tetanus is UTD. Laceration occurred < 12 hours prior to repair. Discussed laceration care with pt and answered questions. Pt to f-u for suture removal in 14 days if desired (absorbable sutures) and wound check sooner should there be signs of dehiscence or infection. Pt is hemodynamically stable with no complaints prior to dc.  Discussed result, findings, treatment, and follow up  with patient.  Pt given return precautions.  Pt verbalizes understanding and agrees with plan.      Final diagnoses:  Hand laceration, left, initial encounter    New Prescriptions Discharge Medication List as of 08/14/2015  7:05 PM      I personally performed the services described in this  documentation, which was scribed in my presence. The recorded information has been reviewed and is accurate.     Trixie Dredge, PA-C 08/14/15 1929    Alvira Monday, MD 08/16/15 [phone]

## 2015-08-14 NOTE — ED Triage Notes (Signed)
Pt complains of 3cm lac to left hand from opening a can of food at 5PM today. Bleeding controlled by EMS.

## 2015-08-14 NOTE — Progress Notes (Signed)
Left side of left index finger irrigated and 2x2 placed. Pt has full ROm of his finger.

## 2015-08-14 NOTE — Discharge Instructions (Signed)
Read the information below.  You may return to the Emergency Department at any time for worsening condition or any new symptoms that concern you.  If you develop redness, swelling, pus draining from the wound, or fevers greater than 100.4, return to the ER immediately for a recheck.   °

## 2015-10-28 ENCOUNTER — Emergency Department (HOSPITAL_COMMUNITY)
Admission: EM | Admit: 2015-10-28 | Discharge: 2015-10-28 | Disposition: A | Discharge status: Home / Self Care | Payer: Self-pay | Attending: Emergency Medicine | Admitting: Emergency Medicine

## 2015-10-28 ENCOUNTER — Encounter (HOSPITAL_COMMUNITY): Payer: Self-pay

## 2015-10-28 DIAGNOSIS — J069 Acute upper respiratory infection, unspecified: Secondary | ICD-10-CM | POA: Insufficient documentation

## 2015-10-28 DIAGNOSIS — H66001 Acute suppurative otitis media without spontaneous rupture of ear drum, right ear: Secondary | ICD-10-CM | POA: Insufficient documentation

## 2015-10-28 DIAGNOSIS — Z79899 Other long term (current) drug therapy: Secondary | ICD-10-CM | POA: Insufficient documentation

## 2015-10-28 LAB — RAPID STREP SCREEN (MED CTR MEBANE ONLY): STREPTOCOCCUS, GROUP A SCREEN (DIRECT): NEGATIVE

## 2015-10-28 MED ORDER — DIPHENHYDRAMINE HCL 25 MG PO TABS: 25.0000 mg | ORAL_TABLET | Freq: Four times a day (QID) | ORAL | 0 refills | Status: DC

## 2015-10-28 MED ORDER — PSEUDOEPHEDRINE HCL 60 MG PO TABS: 60.0000 mg | ORAL_TABLET | Freq: Four times a day (QID) | ORAL | 0 refills | Status: DC | PRN

## 2015-10-28 MED ORDER — AMOXICILLIN 500 MG PO CAPS: 500.0000 mg | ORAL_CAPSULE | Freq: Two times a day (BID) | ORAL | 0 refills | Status: DC

## 2015-10-28 MED ORDER — IBUPROFEN 800 MG PO TABS: 800.0000 mg | ORAL_TABLET | Freq: Three times a day (TID) | ORAL | 0 refills | Status: DC | PRN

## 2015-10-28 NOTE — Discharge Instructions (Signed)
Read the information below.  Use the prescribed medication as directed.  Please discuss all new medications with your pharmacist.  You may return to the Emergency Department at any time for worsening condition or any new symptoms that concern you.     If you develop fevers, uncontrolled ear pain, bleeding or discharge from your ear, see your doctor or return for a recheck.    °

## 2015-10-28 NOTE — ED Triage Notes (Signed)
Pt states that Monday he started having body aches. He states that it has progressed to bilateral ear pain, nasal congestion, and a sore swollen throat. Pt endorses pain with swallowing. Denies N/V/D/F. A&Ox4. Ambulatory.

## 2015-10-28 NOTE — ED Provider Notes (Signed)
[facility]-EMER[facility] DEPT Provider Note   CSN: [id number] Arrival date & time: 10/28/15  1715   By signing my name below, I, Clovis Pu, attest that this documentation has been prepared under the direction and in the presence of  [facility], PA-C. Electronically Signed: Clovis Pu, ED Scribe. 10/28/15. 6:16 PM.   History   Chief Complaint Chief Complaint  Patient presents with  . Cough  . Sore Throat  . Otalgia    The history is provided by the patient. No language interpreter was used.   HPI Comments:  John Mckee is a 30 y.o. male who presents to the Emergency Department complaining of sore throat and R ear pain x 4 days. Associated symptoms includes a mild cough, rhinorrhea and congestion. Pt denies fevers, chills, current body aches, and ear discharge. He has used throat spray, cough drops and cold&flu medicine with no relief. Pt denies any other complaints at this time.  History reviewed. No pertinent past medical history.  There are no active problems to display for this patient.   History reviewed. No pertinent surgical history.   Home Medications    Prior to Admission medications   Medication Sig Start Date End Date Taking? Authorizing Provider  amoxicillin (AMOXIL) 500 MG capsule Take 1 capsule (500 mg total) by mouth 2 (two) times daily. 10/28/15   Trixie Dredge, PA-C  diphenhydrAMINE (BENADRYL) 25 MG tablet Take 1 tablet (25 mg total) by mouth every 6 (six) hours. 10/28/15   Trixie Dredge, PA-C  ibuprofen (ADVIL,MOTRIN) 800 MG tablet Take 1 tablet (800 mg total) by mouth every 8 (eight) hours as needed for mild pain or moderate pain. 10/28/15   Trixie Dredge, PA-C  loratadine (CLARITIN) 10 MG tablet Take 10 mg by mouth daily as needed for allergies.    Historical Provider, MD  pseudoephedrine (SUDAFED) 60 MG tablet Take 1 tablet (60 mg total) by mouth every 6 (six) hours as needed for congestion. 10/28/15   Trixie Dredge, PA-C    Family History History reviewed. No  pertinent family history.  Social History Social History  Substance Use Topics  . Smoking status: Never Smoker  . Smokeless tobacco: Never Used  . Alcohol use Yes     Allergies   Review of patient's allergies indicates no known allergies.   Review of Systems Review of Systems  Constitutional: Negative for chills and fever.  HENT: Positive for congestion, ear pain, rhinorrhea and sore throat. Negative for ear discharge, facial swelling and trouble swallowing.   Respiratory: Positive for cough. Negative for shortness of breath.   Skin: Negative for rash.  Allergic/Immunologic: Negative for immunocompromised state.     Physical Exam Updated Vital Signs BP 122/76 (BP Location: Left Arm)   Pulse 77   Temp 99.4 F (37.4 C) (Oral)   Resp 20   Ht 5' 11.5" (1.816 m)   Wt 65.8 kg   SpO2 100%   BMI 19.94 kg/m   Physical Exam  Constitutional: He appears well-developed and well-nourished. No distress.  HENT:  Head: Normocephalic and atraumatic.  Left Ear: Tympanic membrane is erythematous.  Nose: Mucosal edema present.  Mouth/Throat: Posterior oropharyngeal erythema present.  Anterior cervical lymph node tenderness. Slight erythema of pharynx. R TM with purulent effusion. L TM erythematous. Nasal mucosa erythematous and slight edematous.  Neck: Neck supple.  Pulmonary/Chest: Effort normal.  Neurological: He is alert.  Skin: He is not diaphoretic.  Nursing note and vitals reviewed.    ED Treatments / Results  DIAGNOSTIC STUDIES:  Oxygen Saturation is 100% on RA, normal by my interpretation.    COORDINATION OF CARE:  6:11 PM Discussed treatment plan with pt at bedside and pt agreed to plan.  Labs (all labs ordered are listed, but only abnormal results are displayed) Labs Reviewed  RAPID STREP SCREEN (NOT AT [facility])  CULTURE, GROUP A STREP [facility])    EKG  EKG Interpretation None       Radiology No results found.  Procedures Procedures (including critical  care time)  Medications Ordered in ED Medications - No data to display   Initial Impression / Assessment and Plan / ED Course  I have reviewed the triage vital signs and the nursing notes.  Pertinent labs & imaging results that were available during my care of the patient were reviewed by me and considered in my medical decision making (see chart for details).  Clinical Course    Pt symptoms consistent with URI now with purulent right otitis media. Pt will be discharged with symptomatic treatment and amoxicillin.  Pt is hemodynamically stable & in NAD prior to discharge.  Discussed result, findings, treatment, and follow up  with patient.  Pt given return precautions.  Pt verbalizes understanding and agrees with plan.      Final Clinical Impressions(s) / ED Diagnoses   Final diagnoses:  Viral upper respiratory tract infection  Acute suppurative otitis media of right ear without spontaneous rupture of tympanic membrane, recurrence not specified    New Prescriptions Discharge Medication List as of 10/28/2015  6:22 PM    START taking these medications   Details  amoxicillin (AMOXIL) 500 MG capsule Take 1 capsule (500 mg total) by mouth 2 (two) times daily., Starting Fri 10/28/2015, Print    diphenhydrAMINE (BENADRYL) 25 MG tablet Take 1 tablet (25 mg total) by mouth every 6 (six) hours., Starting Fri 10/28/2015, Print    ibuprofen (ADVIL,MOTRIN) 800 MG tablet Take 1 tablet (800 mg total) by mouth every 8 (eight) hours as needed for mild pain or moderate pain., Starting Fri 10/28/2015, Print    pseudoephedrine (SUDAFED) 60 MG tablet Take 1 tablet (60 mg total) by mouth every 6 (six) hours as needed for congestion., Starting Fri 10/28/2015, Print       I personally performed the services described in this documentation, which was scribed in my presence. The recorded information has been reviewed and is accurate.     StrykerEmily Octavion Mollenkopf, PA-C 10/28/15 [id number]    Rolan BuccoMelanie Belfi, MD 10/28/15  [phone]

## 2015-10-31 LAB — CULTURE, GROUP A STREP (THRC)

## 2016-01-19 ENCOUNTER — Emergency Department (HOSPITAL_COMMUNITY)
Admission: EM | Admit: 2016-01-19 | Discharge: 2016-01-19 | Disposition: A | Discharge status: Home / Self Care | Payer: Self-pay | Attending: Emergency Medicine | Admitting: Emergency Medicine

## 2016-01-19 ENCOUNTER — Encounter (HOSPITAL_COMMUNITY): Payer: Self-pay

## 2016-01-19 DIAGNOSIS — Y990 Civilian activity done for income or pay: Secondary | ICD-10-CM | POA: Insufficient documentation

## 2016-01-19 DIAGNOSIS — Y939 Activity, unspecified: Secondary | ICD-10-CM | POA: Insufficient documentation

## 2016-01-19 DIAGNOSIS — Y929 Unspecified place or not applicable: Secondary | ICD-10-CM | POA: Insufficient documentation

## 2016-01-19 DIAGNOSIS — S39012A Strain of muscle, fascia and tendon of lower back, initial encounter: Secondary | ICD-10-CM | POA: Insufficient documentation

## 2016-01-19 DIAGNOSIS — X500XXA Overexertion from strenuous movement or load, initial encounter: Secondary | ICD-10-CM | POA: Insufficient documentation

## 2016-01-19 MED ORDER — CYCLOBENZAPRINE HCL 10 MG PO TABS: 10.0000 mg | ORAL_TABLET | Freq: Three times a day (TID) | ORAL | 0 refills | Status: DC | PRN

## 2016-01-19 MED ORDER — NAPROXEN 375 MG PO TABS: 375.0000 mg | ORAL_TABLET | Freq: Two times a day (BID) | ORAL | 0 refills | Status: AC | PRN

## 2016-01-19 MED ORDER — KETOROLAC TROMETHAMINE 60 MG/2ML IM SOLN
60.0000 mg | Freq: Once | INTRAMUSCULAR | Status: AC
  Administered 2016-01-19: 60 mg via INTRAMUSCULAR
  Filled 2016-01-19: qty 2

## 2016-01-19 NOTE — ED Triage Notes (Signed)
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

## 2016-01-19 NOTE — ED Provider Notes (Signed)
[facility]-EMERGENCY DEPT Provider Note   CSN: [id number] Arrival date & time: 01/19/16  7829     History   Chief Complaint Chief Complaint  Patient presents with  . Back Pain    HPI John Mckee is a 31 y.o. male.  HPI  31 year old male with no significant past medical history here with mild back pain. 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 Later that night, he developed a mild, aching, throbbing mid thoracic back pain that has persisted. He has taken ibuprofen with intermittent relief. Pain is worse with movement and palpation. No alleviating factors. No lower extremity numbness or weakness. No loss of bowel or bladder continence.  History reviewed. No pertinent past medical history.  There are no active problems to display for this patient.   History reviewed. No pertinent surgical history.     Home Medications    Prior to Admission medications   Medication Sig Start Date End Date Taking? Authorizing Provider  ibuprofen (ADVIL,MOTRIN) 200 MG tablet Take 800 mg by mouth every 6 (six) hours as needed for mild pain.   Yes Historical Provider, MD  cyclobenzaprine (FLEXERIL) 10 MG tablet Take 1 tablet (10 mg total) by mouth 3 (three) times daily as needed for muscle spasms. 01/19/16   Shaune Pollack, MD  naproxen (NAPROSYN) 375 MG tablet Take 1 tablet (375 mg total) by mouth 2 (two) times daily as needed for moderate pain. 01/19/16 01/26/16  Shaune Pollack, MD    Family History History reviewed. No pertinent family history.  Social History Social History  Substance Use Topics  . Smoking status: Never Smoker  . Smokeless tobacco: Never Used  . Alcohol use Yes     Allergies   Patient has no known allergies.   Review of Systems Review of Systems  Constitutional: Negative for chills, fatigue and fever.  HENT: Negative for congestion and rhinorrhea.   Eyes: Negative for visual disturbance.  Respiratory: Negative for cough, shortness of  breath and wheezing.   Cardiovascular: Negative for chest pain and leg swelling.  Gastrointestinal: Negative for abdominal pain, diarrhea, nausea and vomiting.  Genitourinary: Negative for dysuria and flank pain.  Musculoskeletal: Positive for arthralgias and back pain. Negative for neck pain and neck stiffness.  Skin: Negative for rash and wound.  Allergic/Immunologic: Negative for immunocompromised state.  Neurological: Negative for syncope, weakness and headaches.  All other systems reviewed and are negative.    Physical Exam Updated Vital Signs BP 98/68 (BP Location: Right Arm)   Pulse 76   Temp 98.4 F (36.9 C) (Oral)   Resp 20   Ht 5\' 11"  (1.803 m)   Wt 145 lb (65.8 kg)   SpO2 100%   BMI 20.22 kg/m   Physical Exam  Constitutional: He is oriented to person, place, and time. He appears well-developed and well-nourished. No distress.  HENT:  Head: Normocephalic and atraumatic.  Eyes: Conjunctivae are normal.  Neck: Neck supple.  Cardiovascular: Normal rate, regular rhythm and normal heart sounds.  Exam reveals no friction rub.   No murmur heard. Pulmonary/Chest: Effort normal and breath sounds normal. No respiratory distress. He has no wheezes. He has no rales.  Abdominal: Soft. He exhibits no distension. There is no tenderness.  Musculoskeletal: He exhibits no edema.  Neurological: He is alert and oriented to person, place, and time. He exhibits normal muscle tone.  Skin: Skin is warm and dry. Capillary refill takes less than 2 seconds.  Psychiatric: He  has a normal mood and affect.  Nursing note and vitals reviewed.   Spine Exam: Inspection/Palpation: Moderate paraspinal TTP in thoracic spine. Minimal midline TTP. No deformity. Strength: 5/5 throughout LE bilaterally (hip flexion/extension, adduction/abduction; knee flexion/extension; foot dorsiflexion/plantarflexion, inversion/eversion; great toe inversion) Sensation: Intact to light touch in proximal and distal LE  bilaterally Reflexes: 2+ quadriceps and achilles reflexes  ED Treatments / Results  Labs (all labs ordered are listed, but only abnormal results are displayed) Labs Reviewed - No data to display  EKG  EKG Interpretation None       Radiology No results found.  Procedures Procedures (including critical care time)  Medications Ordered in ED Medications  ketorolac (TORADOL) injection 60 mg (60 mg Intramuscular Given 01/19/16 0829)     Initial Impression / Assessment and Plan / ED Course  I have reviewed the triage vital signs and the nursing notes.  Pertinent labs & imaging results that were available during my care of the patient were reviewed by me and considered in my medical decision making (see chart for details).  Clinical Course     31 yo M with no significant PMHx who p/w atraumatic mid thoracic pain after lifting/twisting injury. No midline TTP. No trauma. Exam is as above. Pt has no lower extremity weakness, numbness, loss of bowel or bladder function or other s/s cauda equina. No fever, chills, h/o IVDU, h/o immune suppression, or risk factors for osteo/epidural abscess. Will d/c with NSAIDs, flexeril, and work note.  Final Clinical Impressions(s) / ED Diagnoses   Final diagnoses:  Strain of lumbar paraspinous muscle, initial encounter    New Prescriptions Discharge Medication List as of 01/19/2016  8:22 AM    START taking these medications   Details  cyclobenzaprine (FLEXERIL) 10 MG tablet Take 1 tablet (10 mg total) by mouth 3 (three) times daily as needed for muscle spasms., Starting Thu 01/19/2016, Print    naproxen (NAPROSYN) 375 MG tablet Take 1 tablet (375 mg total) by mouth 2 (two) times daily as needed for moderate pain., Starting Thu 01/19/2016, Until Thu 01/26/2016, Print         Shaune Pollackameron Thaison Kolodziejski, MD 01/19/16 [phone]

## 2016-08-02 ENCOUNTER — Emergency Department (HOSPITAL_COMMUNITY): Payer: Self-pay

## 2016-08-02 ENCOUNTER — Emergency Department (HOSPITAL_COMMUNITY)
Admission: EM | Admit: 2016-08-02 | Discharge: 2016-08-02 | Disposition: A | Discharge status: Home / Self Care | Payer: Self-pay | Attending: Emergency Medicine | Admitting: Emergency Medicine

## 2016-08-02 ENCOUNTER — Encounter (HOSPITAL_COMMUNITY): Payer: Self-pay | Admitting: Emergency Medicine

## 2016-08-02 DIAGNOSIS — Y929 Unspecified place or not applicable: Secondary | ICD-10-CM | POA: Insufficient documentation

## 2016-08-02 DIAGNOSIS — Y939 Activity, unspecified: Secondary | ICD-10-CM | POA: Insufficient documentation

## 2016-08-02 DIAGNOSIS — Y999 Unspecified external cause status: Secondary | ICD-10-CM | POA: Insufficient documentation

## 2016-08-02 DIAGNOSIS — S62354A Nondisplaced fracture of shaft of fourth metacarpal bone, right hand, initial encounter for closed fracture: Secondary | ICD-10-CM | POA: Insufficient documentation

## 2016-08-02 DIAGNOSIS — W2209XA Striking against other stationary object, initial encounter: Secondary | ICD-10-CM | POA: Insufficient documentation

## 2016-08-02 DIAGNOSIS — S62304A Unspecified fracture of fourth metacarpal bone, right hand, initial encounter for closed fracture: Secondary | ICD-10-CM

## 2016-08-02 MED ORDER — IBUPROFEN 600 MG PO TABS: 600.0000 mg | ORAL_TABLET | Freq: Four times a day (QID) | ORAL | 0 refills | Status: DC | PRN

## 2016-08-02 MED ORDER — HYDROCODONE-ACETAMINOPHEN 5-325 MG PO TABS: 2.0000 | ORAL_TABLET | Freq: Four times a day (QID) | ORAL | 0 refills | Status: DC | PRN

## 2016-08-02 MED ORDER — HYDROCODONE-ACETAMINOPHEN 5-325 MG PO TABS
1.0000 | ORAL_TABLET | Freq: Once | ORAL | Status: AC
  Administered 2016-08-02: 1 via ORAL
  Filled 2016-08-02: qty 1

## 2016-08-02 NOTE — ED Provider Notes (Signed)
[facility]-EMERGENCY DEPT Provider Note   CSN: [id number] Arrival date & time: 08/02/16  2102     History   Chief Complaint Chief Complaint  Patient presents with  . Hand Injury    HPI John Mckee is a 31 y.o. male.  HPI  31 year old male presenting for evaluation of right hand injury. Patient states he was upset with some family issues tonight, he went outside, and punched a wall with his right dominant hand. Report acute onset of sharp pain with associate swelling to the dorsum of his right hand. Pain is 10 out of 10, constant and dull but sharp and stabbing with movement. No associated numbness. No wrist or elbow pain. No specific treatment tried.    History reviewed. No pertinent past medical history.  There are no active problems to display for this patient.   Past Surgical History:  Procedure Laterality Date  . APPENDECTOMY         Home Medications    Prior to Admission medications   Medication Sig Start Date End Date Taking? Authorizing Provider  ibuprofen (ADVIL,MOTRIN) 200 MG tablet Take 800 mg by mouth every 6 (six) hours as needed for mild pain.   Yes [provider]  cyclobenzaprine (FLEXERIL) 10 MG tablet Take 1 tablet (10 mg total) by mouth 3 (three) times daily as needed for muscle spasms. Patient not taking: Reported on 08/02/2016 01/19/16   Shaune PollackIsaacs, Cameron, MD    Family History No family history on file.  Social History Social History  Substance Use Topics  . Smoking status: Never Smoker  . Smokeless tobacco: Never Used  . Alcohol use Yes     Allergies   Patient has no known allergies.   Review of Systems Review of Systems  Constitutional: Negative for fever.  Musculoskeletal: Positive for arthralgias.  Skin: Negative for rash and wound.  Neurological: Negative for numbness.     Physical Exam Updated Vital Signs BP 134/82 (BP Location: Right Arm)   Pulse 63   Temp 98.7 F (37.1 C) (Oral)   Resp 18   Ht 6' (1.829 m)    Wt 66.7 kg (147 lb)   SpO2 100%   BMI 19.94 kg/m   Physical Exam  Constitutional: He appears well-developed and well-nourished. No distress.  HENT:  Head: Atraumatic.  Eyes: Conjunctivae are normal.  Neck: Neck supple.  Musculoskeletal: He exhibits tenderness (Right hand: Exquisite tenderness noted to the dorsum of hand involving the third fourth and fifth metacarpal carpal region with associated swelling and mild crepitus on palpation. Decreased grip strength secondary to pain. Pain with finger flexion).  Radial pulse 2+ bilaterally, right wrist and right elbow are nontender to palpation with full range of motion.  Neurological: He is alert.  Skin: No rash noted.  Psychiatric: He has a normal mood and affect.  Nursing note and vitals reviewed.    ED Treatments / Results  Labs (all labs ordered are listed, but only abnormal results are displayed) Labs Reviewed - No data to display  EKG  EKG Interpretation None       Radiology Dg Hand Complete Right  Result Date: 08/02/2016 CLINICAL DATA:  Punched a wall with hand pain, initial encounter EXAM: RIGHT HAND - COMPLETE 3+ VIEW COMPARISON:  06/14/2012 FINDINGS: Transverse fracture through the midshaft of the fourth metacarpal is noted. Mild angulation is seen. No other fractures are noted. No soft tissue abnormality is seen. IMPRESSION: Fourth metacarpal fracture Electronically Signed   By: Eulah PontMark  Lukens M.D.  On: 08/02/2016 21:49    Procedures Procedures (including critical care time)  Medications Ordered in ED Medications  HYDROcodone-acetaminophen (NORCO/VICODIN) 5-325 MG per tablet 1 tablet (1 tablet Oral Given 08/02/16 2250)     Initial Impression / Assessment and Plan / ED Course  I have reviewed the triage vital signs and the nursing notes.  Pertinent labs & imaging results that were available during my care of the patient were reviewed by me and considered in my medical decision making (see chart for details).       BP 134/82 (BP Location: Right Arm)   Pulse 63   Temp 98.7 F (37.1 C) (Oral)   Resp 18   Ht 6' (1.829 m)   Wt 66.7 kg (147 lb)   SpO2 100%   BMI 19.94 kg/m    Final Clinical Impressions(s) / ED Diagnoses   Final diagnoses:  Closed displaced fracture of fourth metacarpal bone of right hand, unspecified portion of metacarpal, initial encounter    New Prescriptions New Prescriptions   HYDROCODONE-ACETAMINOPHEN (NORCO/VICODIN) 5-325 MG TABLET    Take 2 tablets by mouth every 6 (six) hours as needed for moderate pain or severe pain.   IBUPROFEN (ADVIL,MOTRIN) 600 MG TABLET    Take 1 tablet (600 mg total) by mouth every 6 (six) hours as needed.   10:57 PM Patient injured his right dominant hand after punching a wall earlier today. X-ray demonstrate a fourth metacarpal fracture. This is a closed injury. Patient is neurovascularly intact. No finger involvement. There is a mild angulation seen on x-ray. Patient will be placed in an ulnar gutter splint. Pain medication prescribed. Refer to hand specialist given. In order to decrease risk of narcotic abuse. Pt's record were checked using the [place] Controlled Substance database.     Fayrene Helperran, Haruka Kowaleski, PA-C 08/02/16 2258    Mancel BaleWentz, Elliott, MD 08/04/16 [phone]

## 2016-08-02 NOTE — ED Notes (Signed)
Pt ambulatory and independent at discharge.  Verbalized understanding of discharge instructions 

## 2016-08-02 NOTE — ED Notes (Signed)
Ortho tech at bedside 

## 2016-08-02 NOTE — Discharge Instructions (Signed)
You have a broken 4th metacarpal bone involving your right hand.  Follow up with hand specialist for further care.

## 2016-08-02 NOTE — ED Triage Notes (Signed)
Pt comes in with right hand pain after punching a wall today. Pt soaked his hand in epsom salt without relief.  A&O x4. Ambulatory.

## 2016-08-27 ENCOUNTER — Encounter (HOSPITAL_COMMUNITY): Payer: Self-pay | Admitting: Emergency Medicine

## 2016-08-27 ENCOUNTER — Emergency Department (HOSPITAL_COMMUNITY)
Admission: EM | Admit: 2016-08-27 | Discharge: 2016-08-27 | Disposition: A | Discharge status: Home / Self Care | Payer: Self-pay | Attending: Emergency Medicine | Admitting: Emergency Medicine

## 2016-08-27 DIAGNOSIS — Z09 Encounter for follow-up examination after completed treatment for conditions other than malignant neoplasm: Secondary | ICD-10-CM | POA: Insufficient documentation

## 2016-08-27 NOTE — ED Notes (Signed)
PT SEEN HERE 08/02/16. DX WITH FX RT HAND. PT TO FOLLOW UP WITH ORTHO. PT DECLINED. PT REQUESTED TO BE RELEASED TO GO BACK TO WORK BY EDP. PT HERE FOR WORK NOTE TO GO BACK TO WORK WITHOUT RESTRICTIONS

## 2016-08-27 NOTE — ED Notes (Signed)
EDPA Provider at bedside. 

## 2016-08-27 NOTE — ED Provider Notes (Signed)
[facility]-EMERGENCY DEPT Provider Note   CSN: [id number] Arrival date & time: 08/27/16  1255     History   Chief Complaint Chief Complaint  Patient presents with  . Hand Injury    HPI John Mckee is a 31 y.o. male.  HPI   John Mckee is a 31 y.o. male, presenting to the ED with a request for a work note to return to work.  Patient sustained a right fourth metacarpal fracture on July 26. Fracture slightly angulated dorsally. Patient  was asked to follow-up with Dr. Merlyn Lot of hand surgery, but did not do so. He also prematurely removed his splint about three days ago.  He endorses minor "soreness." Denies numbness, tingling, weakness, or other abnormalities.       History reviewed. No pertinent past medical history.  There are no active problems to display for this patient.   Past Surgical History:  Procedure Laterality Date  . APPENDECTOMY         Home Medications    Prior to Admission medications   Medication Sig Start Date End Date Taking? Authorizing Provider  cyclobenzaprine (FLEXERIL) 10 MG tablet Take 1 tablet (10 mg total) by mouth 3 (three) times daily as needed for muscle spasms. Patient not taking: Reported on 08/02/2016 01/19/16   Shaune Pollack, MD  HYDROcodone-acetaminophen (NORCO/VICODIN) 5-325 MG tablet Take 2 tablets by mouth every 6 (six) hours as needed for moderate pain or severe pain. 08/02/16   Fayrene Helper, PA-C  ibuprofen (ADVIL,MOTRIN) 600 MG tablet Take 1 tablet (600 mg total) by mouth every 6 (six) hours as needed. 08/02/16   Fayrene Helper, PA-C    Family History No family history on file.  Social History Social History  Substance Use Topics  . Smoking status: Never Smoker  . Smokeless tobacco: Never Used  . Alcohol use Yes     Allergies   Patient has no known allergies.   Review of Systems Review of Systems  Constitutional: Negative for fever.  Musculoskeletal:       Requests clearance back to work following right 4th  metacarpal fracture.  Neurological: Negative for weakness and numbness.     Physical Exam Updated Vital Signs BP 104/86 (BP Location: Left Arm)   Pulse 74   Temp 98.2 F (36.8 C) (Oral)   Resp 16   SpO2 97%   Physical Exam  Constitutional: He appears well-developed and well-nourished. No distress.  HENT:  Head: Normocephalic and atraumatic.  Eyes: Conjunctivae are normal.  Neck: Neck supple.  Cardiovascular: Normal rate, regular rhythm and intact distal pulses.   Pulmonary/Chest: Effort normal.  Musculoskeletal: He exhibits deformity. He exhibits no edema or tenderness.  Some minor appearing dorsal angulation over the right fourth metacarpal. No noted tenderness, swelling, erythema, or increased warmth. Full range of motion in the right fingers, hand, and wrist.  Neurological: He is alert.  No noted sensory deficits in the patient's right hand. 5/5 strength with flexion and extension at each of the MCP, PIP, and DIP joints in the fingers of the right hand. Grip strength 5/5. Abduction and abduction against resistance intact.  Skin: Skin is warm and dry. Capillary refill takes less than 2 seconds. He is not diaphoretic.  Psychiatric: He has a normal mood and affect. His behavior is normal.  Nursing note and vitals reviewed.    ED Treatments / Results  Labs (all labs ordered are listed, but only abnormal results are displayed) Labs Reviewed - No data to display  EKG  EKG Interpretation None       Radiology No results found.  Procedures Procedures (including critical care time)  Medications Ordered in ED Medications - No data to display   Initial Impression / Assessment and Plan / ED Course  I have reviewed the triage vital signs and the nursing notes.  Pertinent labs & imaging results that were available during my care of the patient were reviewed by me and considered in my medical decision making (see chart for details).     Patient presents requesting a  return to work note. 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 Patient seems to have some angulated healing. He was told he would need to follow up with Hand surgery as soon as possible on this matter as it may need revision. On my exam, he has no signs of weakness or other neuro or functional deficits. He was advised to use caution and to stop performing his duties at work should he experience weakness, numbness, or other deficits. Patient voiced understanding of all instructions.  Findings and plan of care discussed with Theda Belfast, MD.   Vitals:   08/27/16 1318 08/27/16 1444  BP: 104/86 125/68  Pulse: 74 62  Resp: 16 18  Temp: 98.2 F (36.8 C) 98.1 F (36.7 C)  TempSrc: Oral Oral  SpO2: 97% 100%     Final Clinical Impressions(s) / ED Diagnoses   Final diagnoses:  Follow-up examination    New Prescriptions Discharge Medication List as of 08/27/2016  2:09 PM       Concepcion Living 08/27/16 2311    Tegeler, Canary Brim, MD 08/28/16 1910

## 2016-08-27 NOTE — ED Triage Notes (Signed)
Pt from home following a hand injury on 7/26. Pt states his symptoms have resolved and he just needs a note for his work clearing him to resume his normal duties. Pt denies pain.

## 2016-08-27 NOTE — ED Notes (Signed)
Bed: [facility] Expected date:  Expected time:  Means of arrival:  Comments: 

## 2016-08-27 NOTE — Discharge Instructions (Signed)
You have evidence of angulated healing of the fracture. You are strongly encouraged to follow up with the hand surgeon, as previously directed. You may return to work, but stop immediately if you have numbness, weakness, or any other issues.

## 2017-06-06 ENCOUNTER — Emergency Department (HOSPITAL_COMMUNITY)
Admission: EM | Admit: 2017-06-06 | Discharge: 2017-06-06 | Disposition: A | Discharge status: Home / Self Care | Payer: Self-pay | Attending: Emergency Medicine | Admitting: Emergency Medicine

## 2017-06-06 ENCOUNTER — Encounter (HOSPITAL_COMMUNITY): Payer: Self-pay | Admitting: *Deleted

## 2017-06-06 DIAGNOSIS — Z5321 Procedure and treatment not carried out due to patient leaving prior to being seen by health care provider: Secondary | ICD-10-CM | POA: Insufficient documentation

## 2017-06-06 DIAGNOSIS — M549 Dorsalgia, unspecified: Secondary | ICD-10-CM | POA: Insufficient documentation

## 2017-06-06 NOTE — ED Triage Notes (Signed)
Pt complains of lower back pain since working on his car 5 days ago. Pt states he may have pulled a muscle.

## 2017-06-12 ENCOUNTER — Emergency Department (HOSPITAL_COMMUNITY)
Admission: EM | Admit: 2017-06-12 | Discharge: 2017-06-12 | Disposition: A | Discharge status: Home / Self Care | Payer: Self-pay | Attending: Emergency Medicine | Admitting: Emergency Medicine

## 2017-06-12 ENCOUNTER — Encounter (HOSPITAL_COMMUNITY): Payer: Self-pay | Admitting: Family Medicine

## 2017-06-12 DIAGNOSIS — M545 Low back pain, unspecified: Secondary | ICD-10-CM

## 2017-06-12 MED ORDER — PREDNISONE 50 MG PO TABS: ORAL_TABLET | ORAL | 0 refills | Status: DC

## 2017-06-12 MED ORDER — CYCLOBENZAPRINE HCL 10 MG PO TABS: 10.0000 mg | ORAL_TABLET | Freq: Two times a day (BID) | ORAL | 0 refills | Status: DC | PRN

## 2017-06-12 NOTE — Discharge Instructions (Signed)
You may take Advil in addition to the medications we give you.

## 2017-06-12 NOTE — ED Provider Notes (Signed)
[facility]-EMERGENCY DEPT Provider Note   CSN: [id number] Arrival date & time: 06/12/17  1431     History   Chief Complaint Chief Complaint  Patient presents with  . Back Pain    HPI John Mckee is a 32 y.o. male who presents to the ED with back pain. Patient reports the pain started over a week ago and is in the right lower back. The pain started after he was working on his vehicle. Patient was here 5/30 for same complaint but states he had to leave before being seen. He is taking OTC medications without relief. The pain increases with movement. Patient denies UTI symptoms.   The history is provided by the patient. No language interpreter was used.  Back Pain   This is a new problem. The current episode started more than 1 week ago. The problem occurs constantly. The pain is present in the lumbar spine. The pain radiates to the right thigh. Pertinent negatives include no dysuria.    History reviewed. No pertinent past medical history.  There are no active problems to display for this patient.   Past Surgical History:  Procedure Laterality Date  . APPENDECTOMY          Home Medications    Prior to Admission medications   Medication Sig Start Date End Date Taking? Authorizing Provider  cyclobenzaprine (FLEXERIL) 10 MG tablet Take 1 tablet (10 mg total) by mouth 2 (two) times daily as needed for muscle spasms. 06/12/17   Janne Napoleon, NP  predniSONE (DELTASONE) 50 MG tablet Take one tablet PO daily 06/12/17   Janne Napoleon, NP    Family History History reviewed. No pertinent family history.  Social History Social History   Tobacco Use  . Smoking status: Never Smoker  . Smokeless tobacco: Never Used  Substance Use Topics  . Alcohol use: Yes  . Drug use: Yes    Types: Marijuana     Allergies   Patient has no known allergies.   Review of Systems Review of Systems  Genitourinary: Negative for dysuria, frequency and urgency.       No  loss of control of bladder or bowels.  Musculoskeletal: Positive for back pain.  All other systems reviewed and are negative.    Physical Exam Updated Vital Signs BP 135/90 (BP Location: Left Arm)   Pulse 71   Temp 98.4 F (36.9 C) (Oral)   Resp 18   Ht 6' (1.829 m)   Wt 68 kg (150 lb)   SpO2 100%   BMI 20.34 kg/m   Physical Exam  Constitutional: He appears well-developed and well-nourished. No distress.  HENT:  Head: Normocephalic.  Eyes: EOM are normal.  Neck: Neck supple.  Cardiovascular: Normal rate.  Pulmonary/Chest: Effort normal.  Abdominal: Soft. There is no tenderness.  Musculoskeletal:       Lumbar back: He exhibits tenderness and spasm. He exhibits normal range of motion, no deformity, no laceration and normal pulse.       Back:  Neurological: He is alert. He has normal strength. No sensory deficit. Gait normal.  Reflex Scores:      Bicep reflexes are 2+ on the right side and 2+ on the left side.      Brachioradialis reflexes are 2+ on the right side and 2+ on the left side.      Patellar reflexes are 2+ on the right side and 2+ on the left side. Skin: Skin is warm and dry.  Psychiatric: He has a normal mood and affect. His behavior is normal.  Nursing note and vitals reviewed.    ED Treatments / Results  Labs (all labs ordered are listed, but only abnormal results are displayed) Labs Reviewed - No data to display Radiology No results found.  Procedures Procedures (including critical care time)  Medications Ordered in ED Medications - No data to display   Initial Impression / Assessment and Plan / ED Course  I have reviewed the triage vital signs and the nursing notes. Patient with back pain.  No neurological deficits and normal neuro exam.  Patient can walk but states is painful.  No loss of bowel or bladder control.  No concern for cauda equina.  No fever, night sweats, weight loss, h/o cancer, IVDU.  RICE protocol and pain medicine indicated  and discussed with patient.   Final Clinical Impressions(s) / ED Diagnoses   Final diagnoses:  Acute right-sided low back pain without sciatica    ED Discharge Orders        Ordered    cyclobenzaprine (FLEXERIL) 10 MG tablet  2 times daily PRN     06/12/17 1704    predniSONE (DELTASONE) 50 MG tablet     06/12/17 1704       Damian Leavelleese, DeltanaHope M, NP 06/12/17 1709    Jacalyn LefevreHaviland, Julie, MD 06/12/17 1726

## 2017-06-12 NOTE — ED Triage Notes (Signed)
Patient reports about 1.5 weeks ago he developed right lower back pain about a week after working on a vehicle. He does not recall any other injury. Patient is ambulatory from triage to fast track. Patient has been seen on 05/30 for the same complaint. Taking muscle relaxer's and other OTC medication. Denies loss of bowel or bladder. Reports tingling on right lower back.

## 2017-09-03 ENCOUNTER — Emergency Department (HOSPITAL_COMMUNITY): Payer: Self-pay

## 2017-09-03 ENCOUNTER — Other Ambulatory Visit: Payer: Self-pay

## 2017-09-03 ENCOUNTER — Emergency Department (HOSPITAL_COMMUNITY)
Admission: EM | Admit: 2017-09-03 | Discharge: 2017-09-03 | Disposition: A | Discharge status: Home / Self Care | Payer: Self-pay | Attending: Emergency Medicine | Admitting: Emergency Medicine

## 2017-09-03 ENCOUNTER — Encounter (HOSPITAL_COMMUNITY): Payer: Self-pay

## 2017-09-03 DIAGNOSIS — R030 Elevated blood-pressure reading, without diagnosis of hypertension: Secondary | ICD-10-CM | POA: Insufficient documentation

## 2017-09-03 DIAGNOSIS — M5441 Lumbago with sciatica, right side: Secondary | ICD-10-CM | POA: Insufficient documentation

## 2017-09-03 MED ORDER — METHOCARBAMOL 500 MG PO TABS: 500.0000 mg | ORAL_TABLET | Freq: Two times a day (BID) | ORAL | 0 refills | Status: DC

## 2017-09-03 MED ORDER — PREDNISONE 20 MG PO TABS: 40.0000 mg | ORAL_TABLET | Freq: Every day | ORAL | 0 refills | Status: AC

## 2017-09-03 MED ORDER — NAPROXEN 500 MG PO TABS: 500.0000 mg | ORAL_TABLET | Freq: Two times a day (BID) | ORAL | 0 refills | Status: DC

## 2017-09-03 NOTE — ED Triage Notes (Signed)
Patient reports that he has been having intermittent mid lower back pain and states intermittent radiation of pain down both legs x 2 months. Patient states he hurt his back approx 2 months ago while working on his car.

## 2017-09-03 NOTE — ED Provider Notes (Signed)
[facility]-EMERGENCY DEPT Provider Note   CSN: [id number] Arrival date & time: 09/03/17  4098     History   Chief Complaint Chief Complaint  Patient presents with  . Back Pain    HPI John Mckee is a 32 y.o. male.  HPI  Patient is a 32 year old male with no significant past medical history presenting for right-sided low back pain rating down his right leg.  Patient reports that symptoms began with an injury 2 months ago when he felt a "pop" in his lower back.  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  Patient reports that the current episode is increasing over the past week.  Patient denies any weakness or numbness in the lower extremities, saddle anesthesia, loss of bowel or bladder control, fever or chills, history of IVDU, or cancer.  Patient does report that he feels decreased sensation in the groin occasionally, but it is not occurring at present.  No remedies tried home for symptoms.  History reviewed. No pertinent past medical history.  There are no active problems to display for this patient.   Past Surgical History:  Procedure Laterality Date  . APPENDECTOMY          Home Medications    Prior to Admission medications   Medication Sig Start Date End Date Taking? Authorizing Provider  ibuprofen (ADVIL,MOTRIN) 200 MG tablet Take 400 mg by mouth every 6 (six) hours as needed for mild pain.   Yes [provider]  cyclobenzaprine (FLEXERIL) 10 MG tablet Take 1 tablet (10 mg total) by mouth 2 (two) times daily as needed for muscle spasms. Patient not taking: Reported on 09/03/2017 06/12/17   Janne Napoleon, NP  predniSONE (DELTASONE) 50 MG tablet Take one tablet PO daily Patient not taking: Reported on 09/03/2017 06/12/17   Janne Napoleon, NP    Family History History reviewed. No pertinent family history.  Social History Social History   Tobacco Use  . Smoking status: Never Smoker    . Smokeless tobacco: Never Used  Substance Use Topics  . Alcohol use: Yes  . Drug use: Yes    Types: Marijuana     Allergies   Patient has no known allergies.   Review of Systems Review of Systems  Constitutional: Negative for chills and fever.  Musculoskeletal: Positive for arthralgias and back pain.  Allergic/Immunologic: Negative for immunocompromised state.  Neurological: Negative for weakness and numbness.     Physical Exam Updated Vital Signs BP 131/78 (BP Location: Left Arm)   Pulse 64   Temp 98.2 F (36.8 C) (Oral)   Resp 16   Ht 6' (1.829 m)   Wt 65.8 kg   SpO2 100%   BMI 19.67 kg/m   Physical Exam  Constitutional: He appears well-developed and well-nourished. No distress.  Sitting comfortably in bed.  HENT:  Head: Normocephalic and atraumatic.  Eyes: Conjunctivae are normal. Right eye exhibits no discharge. Left eye exhibits no discharge.  EOMs normal to gross examination.  Neck: Normal range of motion.  Cardiovascular: Normal rate and regular rhythm.  Intact, 2+ DP pulses.  Pulmonary/Chest:  Normal respiratory effort. Patient converses comfortably. No audible wheeze or stridor.  Abdominal: He exhibits no distension.  Musculoskeletal: Normal range of motion.  Spine Exam: Inspection/Palpation: No midline tenderness of cervical, thoracic, or lumbar spine.  No step-off.  Patient has diffuse right-sided lower lumbar paraspinal muscular tenderness. Strength: 5/5 throughout LE bilaterally (hip flexion/extension, adduction/abduction;  knee flexion/extension; foot dorsiflexion/plantarflexion, inversion/eversion; great toe inversion) Sensation: Intact to light touch in proximal and distal LE bilaterally Reflexes: 2+ quadriceps and achilles reflexes Normal and symmetric gait. Positive straight leg raise.   Neurological: He is alert.  Cranial nerves intact to gross observation. Patient moves extremities without difficulty.  Skin: Skin is warm and dry. He is  not diaphoretic.  Psychiatric: He has a normal mood and affect. His behavior is normal. Judgment and thought content normal.  Nursing note and vitals reviewed.    ED Treatments / Results  Labs (all labs ordered are listed, but only abnormal results are displayed) Labs Reviewed - No data to display  EKG None  Radiology Dg Lumbar Spine Complete  Result Date: 09/03/2017 CLINICAL DATA:  Low back and right leg pain for several months. 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 The pain has worsened this week. The patient does heavy lifting at work. EXAM: LUMBAR SPINE - COMPLETE 4+ VIEW COMPARISON:  Coronal and sagittal reconstructed images through the lumbar spine from an abdominal and pelvic CT scan of May 20, 2007 FINDINGS: The lumbar vertebral bodies are preserved in height. There is mild loss of the normal lumbar lordosis. There is mild disc space narrowing at L4-5. There is no spondylolisthesis. The pedicles and transverse processes are intact. IMPRESSION: Mild disc space narrowing at L4-5 which appears new. The loss of the normal lumbar lordosis may reflect muscle spasm. No compression fracture or spondylolisthesis. Electronically Signed   By: David  SwazilandJordan M.D.   On: 09/03/2017 11:45    Procedures Procedures (including critical care time)  Medications Ordered in ED Medications - No data to display   Initial Impression / Assessment and Plan / ED Course  I have reviewed the triage vital signs and the nursing notes.  Pertinent labs & imaging results that were available during my care of the patient were reviewed by me and considered in my medical decision making (see chart for details).     Patient nontoxic-appearing and neurologically intact in lower extremities.  Patient denies any concerning symptoms suggestive of cauda equina requiring urgent imaging at this time such as loss of sensation in the lower extremities, lower  extremity weakness, loss of bowel or bladder control, saddle anesthesia, urinary retention, fever/chills, IVDU. Exam demonstrated no  weakness on exam today. No preceding injury or trauma to suggest acute fracture. Doubt pelvic or urinary pathology for patient's acute back pain, as patient denies urinary symptoms.  X-ray lumbar spine ordered due to extensive time course of symptoms, which demonstrated degenerative changes L4-L5.  Suspect sciatica given patient's extensive history of heavy lifting.  Will treat with NSAIDs, steroids, muscle relaxants.  Patient given strict return precautions for any symptoms indicating worsening neurologic function in the lower extremities.   Final Clinical Impressions(s) / ED Diagnoses   Final diagnoses:  Acute right-sided low back pain with right-sided sciatica  Elevated blood pressure reading without diagnosis of hypertension    ED Discharge Orders         Ordered    methocarbamol (ROBAXIN) 500 MG tablet  2 times daily     09/03/17 1244    naproxen (NAPROSYN) 500 MG tablet  2 times daily     09/03/17 1244    predniSONE (DELTASONE) 20 MG tablet  Daily     09/03/17 1244           Aviva KluverMurray, Ellary Casamento B, PA-C 09/03/17 1245  Loren Racer, MD 09/03/17 1459

## 2017-09-03 NOTE — Discharge Instructions (Addendum)
Please see the information and instructions below regarding your visit.  Your diagnoses today include:  1. Acute right-sided low back pain with right-sided sciatica   2. Elevated blood pressure reading without diagnosis of hypertension    About diagnosis. Most episodes of acute low back pain are self-limited. Your exam was reassuring today that the source of your pain is not affecting the spinal cord and nerves that originate in the spinal cord.   If you have a history of disc herniation or arthritis in your spine, the nerves exiting the spine on one side get inflamed. This can cause severe pain. We call this radiculopathy. We do not always know what causes the sudden inflammation.  Tests performed today include: See side panel of your discharge paperwork for testing performed today. Vital signs are listed at the bottom of these instructions.   X-ray shows that you do have some shortening of the space in between some of your vertebrae of your lower back.  Medications prescribed:    Take any prescribed medications only as prescribed, and any over the counter medications only as directed on the packaging.  You are prescribed prednisone, a steroid. This is a medication to help reduce inflammation in the spine.  Common side effects include upset stomach/nausea. You may take this medicine with food if this occurs. Other side effects include restlessness, difficulty sleeping, and increased sweating. Call your healthcare provider if these do not resolve after finishing the medication.  This medicine may increase your blood sugar so additional careful monitoring is needed of blood sugar if you have diabetes. Call your healthcare provider for any signs/symtpoms of high blood sugar such as confusion, feeling sleepy, more thirst, more hunger, passing urine more often, flushing, fast breathing, or breath that smells like fruit.  You are prescribed naproxen, a non-steroidal anti-inflammatory agent  (NSAID) for pain. You may take 500 mg every 12 hours as needed for pain. If still requiring this medication around the clock for acute pain after 10 days, please see your primary healthcare provider.  Women who are pregnant, breastfeeding, or planning on becoming pregnant should not take non-steroidal anti-inflammatories such as Advil and Aleve. Tylenol is a safe over the counter pain reliever in pregnant women.  You may combine this medication with Tylenol, 650 mg every 6 hours, so you are receiving something for pain every 3 hours.  This is not a long-term medication unless under the care and direction of your primary provider. Taking this medication long-term and not under the supervision of a healthcare provider could increase the risk of stomach ulcers, kidney problems, and cardiovascular problems such as high blood pressure.   You are prescribed Robaxin, a muscle relaxant. Some common side effects of this medication include:  Feeling sleepy.  Dizziness. Take care upon going from a seated to a standing position.  Dry mouth.  Feeling tired or weak.  Hard stools (constipation).  Upset stomach. These are not all of the side effects that may occur. If you have questions about side effects, call your doctor. Call your primary care provider for medical advice about side effects.  This medication can be sedating. Only take this medication as needed. Please do not combine with alcohol. Do not drive or operate machinery while taking this medication.   This medication can interact with some other medications. Make sure to tell any provider you are taking this medication before they prescribe you a new medication.   Home care instructions:   Low back pain gets  worse the longer you stay stationary. Please keep moving and walking as tolerated. There are exercises included in this packet to perform as tolerated for your low back pain.   Apply heat to the areas that are painful. Avoid twisting or  bending your trunk to lift something. Do not lift anything above 25 lbs while recovering from this flare of low back pain. This includes bench pressing.   Please follow any educational materials contained in this packet.   Follow-up instructions: Please follow-up with your primary care provider as soon as possible for further evaluation of your symptoms if they are not completely improved.   Please follow up with orthopedics.  Return instructions:  Please return to the Emergency Department if you experience worsening symptoms.  Please return for any fever or chills in the setting of your back pain, weakness in the muscles of the legs, numbness in your legs and feet that is new or changing, numbness in the area where you wipe, retention of your urine, loss of bowel or bladder control, or problems with walking. Please return if you have any other emergent concerns.  Additional Information:   Your vital signs today were: BP 131/78 (BP Location: Left Arm)    Pulse 64    Temp 98.2 F (36.8 C) (Oral)    Resp 16    Ht 6' (1.829 m)    Wt 65.8 kg    SpO2 100%    BMI 19.67 kg/m  If your blood pressure (BP) was elevated on multiple readings during this visit above 130 for the top number or above 80 for the bottom number, please have this repeated by your primary care provider within one month. --------------  Thank you for allowing us to participate in your care today.

## 2017-09-17 ENCOUNTER — Emergency Department (HOSPITAL_COMMUNITY)
Admission: EM | Admit: 2017-09-17 | Discharge: 2017-09-17 | Disposition: A | Discharge status: Home / Self Care | Payer: Self-pay | Attending: Emergency Medicine | Admitting: Emergency Medicine

## 2017-09-17 ENCOUNTER — Other Ambulatory Visit: Payer: Self-pay

## 2017-09-17 ENCOUNTER — Encounter (HOSPITAL_COMMUNITY): Payer: Self-pay | Admitting: *Deleted

## 2017-09-17 DIAGNOSIS — J45909 Unspecified asthma, uncomplicated: Secondary | ICD-10-CM | POA: Insufficient documentation

## 2017-09-17 DIAGNOSIS — J209 Acute bronchitis, unspecified: Secondary | ICD-10-CM | POA: Insufficient documentation

## 2017-09-17 MED ORDER — AZITHROMYCIN 250 MG PO TABS: 250.0000 mg | ORAL_TABLET | Freq: Every day | ORAL | 0 refills | Status: DC

## 2017-09-17 MED ORDER — ALBUTEROL SULFATE HFA 108 (90 BASE) MCG/ACT IN AERS
2.0000 | INHALATION_SPRAY | RESPIRATORY_TRACT | Status: DC
  Administered 2017-09-17: 2 via RESPIRATORY_TRACT
  Filled 2017-09-17: qty 6.7

## 2017-09-17 NOTE — ED Provider Notes (Signed)
[facility]   CSN: [id number] Arrival date & time: 09/17/17  0630     History   Chief Complaint Chief Complaint  Patient presents with  . Cough    HPI John Mckee is a 32 y.o. male.  HPI 32 year old male presents emergency department with productive cough and nasal congestion and low-grade fever at home.  Reports a history of asthma as a child.  He reports he outgrew this.  Reports wheezing last night.  No bronchodilators at home.  Symptoms are mild in severity.  No other complaints at this time.  No significant shortness of breath at rest.   History reviewed. No pertinent past medical history.  There are no active problems to display for this patient.   Past Surgical History:  Procedure Laterality Date  . APPENDECTOMY          Home Medications    Prior to Admission medications   Medication Sig Start Date End Date Taking? Authorizing Provider  azithromycin (ZITHROMAX) 250 MG tablet Take 1 tablet (250 mg total) by mouth daily. Take first 2 tablets together, then 1 every day until finished. 09/17/17   Azalia Bilis, MD    Family History No family history on file.  Social History Social History   Tobacco Use  . Smoking status: Never Smoker  . Smokeless tobacco: Never Used  Substance Use Topics  . Alcohol use: Yes  . Drug use: Yes    Types: Marijuana     Allergies   Patient has no known allergies.   Review of Systems Review of Systems  All other systems reviewed and are negative.    Physical Exam Updated Vital Signs BP 139/66   Pulse 74   Temp 98.9 F (37.2 C) (Oral)   Resp 16   SpO2 100%   Physical Exam  Constitutional: He is oriented to person, place, and time. He appears well-developed and well-nourished.  HENT:  Head: Normocephalic and atraumatic.  Eyes: EOM are normal.  Neck: Normal range of motion.  Cardiovascular: Normal rate, regular rhythm and normal heart sounds.    Pulmonary/Chest: Effort normal and breath sounds normal. No respiratory distress.  Abdominal: Soft. He exhibits no distension. There is no tenderness.  Musculoskeletal: Normal range of motion.  Neurological: He is alert and oriented to person, place, and time.  Skin: Skin is warm and dry.  Psychiatric: He has a normal mood and affect. Judgment normal.  Nursing note and vitals reviewed.    ED Treatments / Results  Labs (all labs ordered are listed, but only abnormal results are displayed) Labs Reviewed - No data to display  EKG None  Radiology No results found.  Procedures Procedures (including critical care time)  Medications Ordered in ED Medications  albuterol (PROVENTIL HFA;VENTOLIN HFA) 108 (90 Base) MCG/ACT inhaler 2 puff (has no administration in time range)     Initial Impression / Assessment and Plan / ED Course  I have reviewed the triage vital signs and the nursing notes.  Pertinent labs & imaging results that were available during my care of the patient were reviewed by me and considered in my medical decision making (see chart for details).    Acute bronchitis. Well appearing   Final Clinical Impressions(s) / ED Diagnoses   Final diagnoses:  Acute bronchitis, unspecified organism    ED Discharge Orders         Ordered    azithromycin (ZITHROMAX) 250 MG tablet  Daily  09/17/17 4268           Azalia Bilis, MD 09/17/17 [phone]

## 2017-09-17 NOTE — ED Triage Notes (Signed)
Pt reports that he has had cough, nasal congestion, and shortness of breath. He reports his friend said he was wheezing while he was asleep. States he "grew out of my asthma" and does not have any inhalers. No wheezing or distress in triage. No fevers.

## 2018-03-05 ENCOUNTER — Emergency Department (HOSPITAL_BASED_OUTPATIENT_CLINIC_OR_DEPARTMENT_OTHER)
Admission: EM | Admit: 2018-03-05 | Discharge: 2018-03-05 | Disposition: A | Discharge status: Home / Self Care | Payer: Self-pay | Attending: Emergency Medicine | Admitting: Emergency Medicine

## 2018-03-05 ENCOUNTER — Encounter (HOSPITAL_BASED_OUTPATIENT_CLINIC_OR_DEPARTMENT_OTHER): Payer: Self-pay | Admitting: Emergency Medicine

## 2018-03-05 ENCOUNTER — Other Ambulatory Visit: Payer: Self-pay

## 2018-03-05 DIAGNOSIS — J111 Influenza due to unidentified influenza virus with other respiratory manifestations: Secondary | ICD-10-CM | POA: Insufficient documentation

## 2018-03-05 DIAGNOSIS — J40 Bronchitis, not specified as acute or chronic: Secondary | ICD-10-CM | POA: Insufficient documentation

## 2018-03-05 MED ORDER — PREDNISONE 20 MG PO TABS: 40.0000 mg | ORAL_TABLET | Freq: Every day | ORAL | 0 refills | Status: DC

## 2018-03-05 MED ORDER — BENZONATATE 100 MG PO CAPS: 100.0000 mg | ORAL_CAPSULE | Freq: Three times a day (TID) | ORAL | 0 refills | Status: DC | PRN

## 2018-03-05 MED ORDER — ALBUTEROL SULFATE HFA 108 (90 BASE) MCG/ACT IN AERS
2.0000 | INHALATION_SPRAY | RESPIRATORY_TRACT | Status: DC | PRN
  Administered 2018-03-05: 2 via RESPIRATORY_TRACT
  Filled 2018-03-05: qty 6.7

## 2018-03-05 MED ORDER — PREDNISONE 50 MG PO TABS
60.0000 mg | ORAL_TABLET | Freq: Once | ORAL | Status: AC
  Administered 2018-03-05: 60 mg via ORAL
  Filled 2018-03-05: qty 1

## 2018-03-05 NOTE — ED Triage Notes (Signed)
Pt with flu like symptoms x 4 days.

## 2018-03-05 NOTE — ED Provider Notes (Signed)
[facility] EMERGENCY DEPARTMENT Provider Note   CSN: [id number] Arrival date & time: 03/05/18  0148    History   Chief Complaint Chief Complaint  Patient presents with  . Influenza    HPI John Mckee is a 33 y.o. male.     Patient presents to the ER for evaluation of flulike illness that has been present for 4 days.  He initially had some chills and myalgias, but now is primarily experiencing cough.  Patient is nonproductive but harsh.  He reports pain in his lungs when he coughs.  He had asthma as a kid but has not had any treatment as an adult.     History reviewed. No pertinent past medical history.  There are no active problems to display for this patient.   Past Surgical History:  Procedure Laterality Date  . APPENDECTOMY          Home Medications    Prior to Admission medications   Medication Sig Start Date End Date Taking? Authorizing Provider  benzonatate (TESSALON) 100 MG capsule Take 1 capsule (100 mg total) by mouth 3 (three) times daily as needed for cough. 03/05/18   Gilda Crease, MD  predniSONE (DELTASONE) 20 MG tablet Take 2 tablets (40 mg total) by mouth daily with breakfast. 03/05/18   , Canary Brim, MD    Family History No family history on file.  Social History Social History   Tobacco Use  . Smoking status: Never Smoker  . Smokeless tobacco: Never Used  Substance Use Topics  . Alcohol use: Yes  . Drug use: Yes    Types: Marijuana     Allergies   Patient has no known allergies.   Review of Systems Review of Systems  Respiratory: Positive for cough.   All other systems reviewed and are negative.    Physical Exam Updated Vital Signs BP 126/72 (BP Location: Right Arm)   Pulse 77   Temp 98.6 F (37 C) (Oral)   Resp 16   Ht 6' (1.829 m)   Wt 68 kg   SpO2 98%   BMI 20.34 kg/m   Physical Exam Vitals signs and nursing note reviewed.  Constitutional:      General: He is not in acute  distress.    Appearance: Normal appearance. He is well-developed.  HENT:     Head: Normocephalic and atraumatic.     Right Ear: Hearing normal.     Left Ear: Hearing normal.     Nose: Nose normal.  Eyes:     Conjunctiva/sclera: Conjunctivae normal.     Pupils: Pupils are equal, round, and reactive to light.  Neck:     Musculoskeletal: Normal range of motion and neck supple.  Cardiovascular:     Rate and Rhythm: Regular rhythm.     Heart sounds: S1 normal and S2 normal. No murmur. No friction rub. No gallop.   Pulmonary:     Effort: Pulmonary effort is normal. No respiratory distress.     Breath sounds: Normal breath sounds.  Chest:     Chest wall: No tenderness.  Abdominal:     General: Bowel sounds are normal.     Palpations: Abdomen is soft.     Tenderness: There is no abdominal tenderness. There is no guarding or rebound. Negative signs include Murphy's sign and McBurney's sign.     Hernia: No hernia is present.  Musculoskeletal: Normal range of motion.  Skin:    General: Skin is warm and dry.  Findings: No rash.  Neurological:     Mental Status: He is alert and oriented to person, place, and time.     GCS: GCS eye subscore is 4. GCS verbal subscore is 5. GCS motor subscore is 6.     Cranial Nerves: No cranial nerve deficit.     Sensory: No sensory deficit.     Coordination: Coordination normal.  Psychiatric:        Speech: Speech normal.        Behavior: Behavior normal.        Thought Content: Thought content normal.      ED Treatments / Results  Labs (all labs ordered are listed, but only abnormal results are displayed) Labs Reviewed - No data to display  EKG None  Radiology No results found.  Procedures Procedures (including critical care time)  Medications Ordered in ED Medications  albuterol (PROVENTIL HFA;VENTOLIN HFA) 108 (90 Base) MCG/ACT inhaler 2 puff (has no administration in time range)  predniSONE (DELTASONE) tablet 60 mg (has no  administration in time range)     Initial Impression / Assessment and Plan / ED Course  I have reviewed the triage vital signs and the nursing notes.  Pertinent labs & imaging results that were available during my care of the patient were reviewed by me and considered in my medical decision making (see chart for details).        Patient presents with upper respiratory infection symptoms.  He has multiple sick contacts at home.  His girlfriend was seen in the ER today as well.  Symptoms most consistent with a viral etiology, possible influenza.  Patient is 4 days in, does not require Tamiflu.  His symptoms are primarily respiratory.  Lungs are clear, no signs of pneumonia.  Vital signs are unremarkable.  Patient will be treated with albuterol, Tessalon, prednisone.  Final Clinical Impressions(s) / ED Diagnoses   Final diagnoses:  Influenza  Bronchitis    ED Discharge Orders         Ordered    predniSONE (DELTASONE) 20 MG tablet  Daily with breakfast     03/05/18 0215    benzonatate (TESSALON) 100 MG capsule  3 times daily PRN     03/05/18 0215           Gilda Crease, MD 03/05/18 [phone]

## 2018-03-26 ENCOUNTER — Other Ambulatory Visit: Payer: Self-pay

## 2018-03-26 ENCOUNTER — Emergency Department (HOSPITAL_COMMUNITY)
Admission: EM | Admit: 2018-03-26 | Discharge: 2018-03-26 | Disposition: A | Discharge status: Home / Self Care | Payer: Self-pay | Attending: Emergency Medicine | Admitting: Emergency Medicine

## 2018-03-26 ENCOUNTER — Encounter (HOSPITAL_COMMUNITY): Payer: Self-pay

## 2018-03-26 DIAGNOSIS — M5431 Sciatica, right side: Secondary | ICD-10-CM | POA: Insufficient documentation

## 2018-03-26 DIAGNOSIS — Z79899 Other long term (current) drug therapy: Secondary | ICD-10-CM | POA: Insufficient documentation

## 2018-03-26 MED ORDER — NAPROXEN 375 MG PO TABS: 375.0000 mg | ORAL_TABLET | Freq: Two times a day (BID) | ORAL | 0 refills | Status: DC

## 2018-03-26 MED ORDER — CYCLOBENZAPRINE HCL 10 MG PO TABS: 10.0000 mg | ORAL_TABLET | Freq: Two times a day (BID) | ORAL | 0 refills | Status: DC | PRN

## 2018-03-26 NOTE — Discharge Instructions (Addendum)
Do not drive while taking the muscle relaxer as it will make you sleepy. Follow up with your doctor or return here if symptoms worsen.  °

## 2018-03-26 NOTE — ED Provider Notes (Signed)
[facility] Provider Note   CSN: [id number] Arrival date & time: 03/26/18  1601    History   Chief Complaint Chief Complaint  Patient presents with  . Back Pain    HPI John Mckee is a 33 y.o. male who presents to the ED with right side back pain. Patient reports having had sciatica in the past and this feels similar. Patient denies loss of control of bladder or bowels.      HPI  History reviewed. No pertinent past medical history.  There are no active problems to display for this patient.   Past Surgical History:  Procedure Laterality Date  . APPENDECTOMY          Home Medications    Prior to Admission medications   Medication Sig Start Date End Date Taking? Authorizing Provider  benzonatate (TESSALON) 100 MG capsule Take 1 capsule (100 mg total) by mouth 3 (three) times daily as needed for cough. 03/05/18   Gilda Crease, MD  cyclobenzaprine (FLEXERIL) 10 MG tablet Take 1 tablet (10 mg total) by mouth 2 (two) times daily as needed for muscle spasms. 03/26/18   Janne Napoleon, NP  naproxen (NAPROSYN) 375 MG tablet Take 1 tablet (375 mg total) by mouth 2 (two) times daily. 03/26/18   Janne Napoleon, NP  predniSONE (DELTASONE) 20 MG tablet Take 2 tablets (40 mg total) by mouth daily with breakfast. 03/05/18   Pollina, Canary Brim, MD    Family History Family History  Problem Relation Age of Onset  . Healthy Mother   . Healthy Father     Social History Social History   Tobacco Use  . Smoking status: Never Smoker  . Smokeless tobacco: Never Used  Substance Use Topics  . Alcohol use: Yes  . Drug use: Yes    Types: Marijuana     Allergies   Patient has no known allergies.   Review of Systems Review of Systems  Musculoskeletal: Positive for back pain.  Skin: Negative for wound.  All other systems reviewed and are negative.    Physical Exam Updated Vital Signs BP 133/77 (BP Location: Left Arm)   Pulse  85   Temp 98.5 F (36.9 C) (Oral)   Resp 16   Ht 6' (1.829 m)   Wt 68 kg   SpO2 98%   BMI 20.34 kg/m   Physical Exam Vitals signs and nursing note reviewed.  Constitutional:      General: He is not in acute distress.    Appearance: He is well-developed.  HENT:     Head: Normocephalic and atraumatic.     Nose: Nose normal.     Mouth/Throat:     Mouth: Mucous membranes are moist.  Eyes:     Extraocular Movements: Extraocular movements intact.     Conjunctiva/sclera: Conjunctivae normal.  Neck:     Musculoskeletal: Neck supple.  Cardiovascular:     Rate and Rhythm: Normal rate.  Pulmonary:     Effort: Pulmonary effort is normal.  Musculoskeletal:     Lumbar back: He exhibits tenderness and spasm. He exhibits normal range of motion and normal pulse.       Back:  Skin:    General: Skin is warm and dry.  Neurological:     Mental Status: He is alert and oriented to person, place, and time.     Sensory: Sensation is intact.     Motor: No weakness.     Gait: Gait normal.  Deep Tendon Reflexes:     Reflex Scores:      Bicep reflexes are 2+ on the right side and 2+ on the left side.      Brachioradialis reflexes are 2+ on the right side and 2+ on the left side.      Patellar reflexes are 2+ on the right side and 2+ on the left side. Psychiatric:        Mood and Affect: Mood normal.      ED Treatments / Results  Labs (all labs ordered are listed, but only abnormal results are displayed) Labs Reviewed - No data to display  EKG None  Radiology No results found.  Procedures Procedures (including critical care time)  Medications Ordered in ED Medications - No data to display   Initial Impression / Assessment and Plan / ED Course  I have reviewed the triage vital signs and the nursing notes. Patient with back pain.  No neurological deficits and normal neuro exam.  Patient can walk but states is painful.  No loss of bowel or bladder control.  No concern for  cauda equina.  No fever, night sweats, weight loss, h/o cancer, IVDU.  RICE protocol and pain medicine indicated and discussed with patient.   Final Clinical Impressions(s) / ED Diagnoses   Final diagnoses:  Sciatica, right side    ED Discharge Orders         Ordered    cyclobenzaprine (FLEXERIL) 10 MG tablet  2 times daily PRN     03/26/18 1813    naproxen (NAPROSYN) 375 MG tablet  2 times daily     03/26/18 1813           Damian Leavell Pioneer, NP 03/26/18 1817    Charlynne Pander, MD 03/26/18 2219

## 2018-03-26 NOTE — ED Notes (Signed)
Provider at bedside

## 2018-03-26 NOTE — ED Triage Notes (Signed)
Patient c/o right lower back pain for "a while."  Patient reports that he has intermittent pain that radiates down the right leg. Patient reports a history of sciatica.

## 2018-03-26 NOTE — ED Notes (Signed)
RX X 2 GIVEN 

## 2018-04-24 ENCOUNTER — Emergency Department (HOSPITAL_COMMUNITY)
Admission: EM | Admit: 2018-04-24 | Discharge: 2018-04-24 | Disposition: A | Discharge status: Home / Self Care | Payer: Self-pay | Attending: Emergency Medicine | Admitting: Emergency Medicine

## 2018-04-24 ENCOUNTER — Other Ambulatory Visit: Payer: Self-pay

## 2018-04-24 ENCOUNTER — Encounter (HOSPITAL_COMMUNITY): Payer: Self-pay

## 2018-04-24 DIAGNOSIS — M545 Low back pain, unspecified: Secondary | ICD-10-CM

## 2018-04-24 DIAGNOSIS — F1290 Cannabis use, unspecified, uncomplicated: Secondary | ICD-10-CM | POA: Insufficient documentation

## 2018-04-24 DIAGNOSIS — G8929 Other chronic pain: Secondary | ICD-10-CM | POA: Insufficient documentation

## 2018-04-24 MED ORDER — LIDOCAINE 5 % EX PTCH: 1.0000 | MEDICATED_PATCH | CUTANEOUS | 0 refills | Status: DC

## 2018-04-24 NOTE — ED Provider Notes (Signed)
[facility]   CSN: [id number] Arrival date & time: 04/24/18  0825   History   Chief Complaint Chief Complaint  Patient presents with  . Back Pain    HPI John Mckee is a 33 y.o. male.     HPI   [age] YOF presents today with complaints of low back pain.  Patient has a history of chronic low back pain.  He notes that this worsened approximately 1 month ago.  He notes no trauma.  He notes it does ache down into his buttock.  He denies any loss of distal sensation strength or motor function, fever, IV drug use, or any other red flags.  He reports using ibuprofen with minimal improvement in symptoms.        History reviewed. No pertinent past medical history.  There are no active problems to display for this patient.   Past Surgical History:  Procedure Laterality Date  . APPENDECTOMY          Home Medications    Prior to Admission medications   Medication Sig Start Date End Date Taking? Authorizing Provider  benzonatate (TESSALON) 100 MG capsule Take 1 capsule (100 mg total) by mouth 3 (three) times daily as needed for cough. 03/05/18   Gilda CreasePollina, Christopher J, MD  cyclobenzaprine (FLEXERIL) 10 MG tablet Take 1 tablet (10 mg total) by mouth 2 (two) times daily as needed for muscle spasms. 03/26/18   Janne NapoleonNeese, Hope M, NP  lidocaine (LIDODERM) 5 % Place 1 patch onto the skin daily. Remove & Discard patch within 12 hours or as directed by MD 04/24/18   Ellen Mayol, Tinnie GensJeffrey, PA-C  naproxen (NAPROSYN) 375 MG tablet Take 1 tablet (375 mg total) by mouth 2 (two) times daily. 03/26/18   Janne NapoleonNeese, Hope M, NP  predniSONE (DELTASONE) 20 MG tablet Take 2 tablets (40 mg total) by mouth daily with breakfast. 03/05/18   Pollina, Canary Brimhristopher J, MD    Family History Family History  Problem Relation Age of Onset  . Healthy Mother   . Healthy Father     Social History Social History   Tobacco Use  . Smoking status: Never Smoker  .  Smokeless tobacco: Never Used  Substance Use Topics  . Alcohol use: Yes  . Drug use: Yes    Types: Marijuana     Allergies   Patient has no known allergies.   Review of Systems Review of Systems  All other systems reviewed and are negative.    Physical Exam Updated Vital Signs BP 137/76 (BP Location: Right Arm)   Pulse (!) 56   Temp 97.7 F (36.5 C) (Oral)   Resp 16   SpO2 100%   Physical Exam Musculoskeletal:     Comments: No CT or L-spine tenderness, back nontender straight leg negative bilateral, bilateral upper and lower extremity sensation strength motor function intact      ED Treatments / Results  Labs (all labs ordered are listed, but only abnormal results are displayed) Labs Reviewed - No data to display  EKG None  Radiology No results found.  Procedures Procedures (including critical care time)  Medications Ordered in ED Medications - No data to display   Initial Impression / Assessment and Plan / ED Course  I have reviewed the triage vital signs and the nursing notes.  Pertinent labs & imaging results that were available during my care of the patient were reviewed by me and considered in my medical decision making (see  chart for details).        33 year old male presents today with chronic low back pain.  No red flags here.  He was referred to orthopedics for ongoing evaluation management.  Return precautions given.  Final Clinical Impressions(s) / ED Diagnoses   Final diagnoses:  Chronic right-sided low back pain without sciatica    ED Discharge Orders         Ordered    lidocaine (LIDODERM) 5 %  Every 24 hours     04/24/18 0847           Eyvonne Mechanic, PA-C 04/24/18 3710    Vanetta Mulders, MD 04/26/18 1413

## 2018-04-24 NOTE — ED Triage Notes (Signed)
Pt reports chronic back pain worsening over the last month. Pt ambulatory. No distress noted.

## 2018-04-24 NOTE — Discharge Instructions (Addendum)
Please read attached information. If you experience any new or worsening signs or symptoms please return to the emergency room for evaluation. Please follow-up with your primary care provider or specialist as discussed. Please use medication prescribed only as directed and discontinue taking if you have any concerning signs or symptoms.   °

## 2018-10-29 ENCOUNTER — Emergency Department (HOSPITAL_COMMUNITY)
Admission: EM | Admit: 2018-10-29 | Discharge: 2018-10-29 | Disposition: A | Discharge status: Home / Self Care | Payer: Self-pay | Attending: Emergency Medicine | Admitting: Emergency Medicine

## 2018-10-29 ENCOUNTER — Emergency Department (HOSPITAL_COMMUNITY): Payer: Self-pay

## 2018-10-29 ENCOUNTER — Other Ambulatory Visit: Payer: Self-pay

## 2018-10-29 ENCOUNTER — Encounter (HOSPITAL_COMMUNITY): Payer: Self-pay

## 2018-10-29 DIAGNOSIS — Y9389 Activity, other specified: Secondary | ICD-10-CM | POA: Insufficient documentation

## 2018-10-29 DIAGNOSIS — Y999 Unspecified external cause status: Secondary | ICD-10-CM | POA: Insufficient documentation

## 2018-10-29 DIAGNOSIS — S3982XA Other specified injuries of lower back, initial encounter: Secondary | ICD-10-CM | POA: Insufficient documentation

## 2018-10-29 DIAGNOSIS — X509XXA Other and unspecified overexertion or strenuous movements or postures, initial encounter: Secondary | ICD-10-CM | POA: Insufficient documentation

## 2018-10-29 DIAGNOSIS — S3992XA Unspecified injury of lower back, initial encounter: Secondary | ICD-10-CM

## 2018-10-29 DIAGNOSIS — J45909 Unspecified asthma, uncomplicated: Secondary | ICD-10-CM | POA: Insufficient documentation

## 2018-10-29 DIAGNOSIS — Y9289 Other specified places as the place of occurrence of the external cause: Secondary | ICD-10-CM | POA: Insufficient documentation

## 2018-10-29 DIAGNOSIS — Z79899 Other long term (current) drug therapy: Secondary | ICD-10-CM | POA: Insufficient documentation

## 2018-10-29 HISTORY — DX: Unspecified asthma, uncomplicated: J45.909

## 2018-10-29 MED ORDER — ACETAMINOPHEN 500 MG PO TABS
1000.0000 mg | ORAL_TABLET | Freq: Once | ORAL | Status: AC
  Administered 2018-10-29: 20:00:00 1000 mg via ORAL
  Filled 2018-10-29: qty 2

## 2018-10-29 MED ORDER — NAPROXEN 500 MG PO TABS
500.0000 mg | ORAL_TABLET | Freq: Once | ORAL | Status: AC
  Administered 2018-10-29: 20:00:00 500 mg via ORAL
  Filled 2018-10-29: qty 1

## 2018-10-29 MED ORDER — METHOCARBAMOL 500 MG PO TABS
1000.0000 mg | ORAL_TABLET | Freq: Once | ORAL | Status: AC
  Administered 2018-10-29: 20:00:00 1000 mg via ORAL
  Filled 2018-10-29: qty 2

## 2018-10-29 MED ORDER — METHOCARBAMOL 500 MG PO TABS: 1000.0000 mg | ORAL_TABLET | Freq: Three times a day (TID) | ORAL | 0 refills | Status: AC

## 2018-10-29 MED ORDER — PREDNISONE 20 MG PO TABS: 40.0000 mg | ORAL_TABLET | Freq: Every day | ORAL | 0 refills | Status: AC

## 2018-10-29 MED ORDER — DICLOFENAC SODIUM 3 % TD GEL: 2.0000 g | Freq: Four times a day (QID) | TRANSDERMAL | 0 refills | Status: DC

## 2018-10-29 MED ORDER — NAPROXEN 500 MG PO TABS: 500.0000 mg | ORAL_TABLET | Freq: Two times a day (BID) | ORAL | 0 refills | Status: DC

## 2018-10-29 NOTE — ED Triage Notes (Signed)
Pt reports he was lifting something at work 2 months ago and felt something pop. Pt reports mild back pain since then. Pt states that he was playing basketball today and bent the wrong way and had a shooting pain in his lower back. Pt reports back is worse than it was before.

## 2018-10-29 NOTE — Discharge Instructions (Signed)
You you were seen in the emergency department for back pain.  X-rays are normal  I suspect your pain is either from a muscular overuse injury, spasm, strain.  Possibly nerve irritation but less likely.  We will treat this with a combination of anti-inflammatories, muscle relaxers, stretches, rest. [id number] mg of acetaminophen (Tylenol) every 6 hours for the next 3-5 days 500 mg of naproxen every 12 hours for the next 3-5 days You can add acetaminophen and naproxen as above every 6-8 hours for maximum pain control. Do not exceed more than 4,000 mg acetaminophen or 1,000 mg naproxen in a 24 hour period.  Do not take naproxen containing products if you are pregnant, have history of kidney disease, ulcers, GI bleeding, severe acid reflux, or take a blood thinner.  Do not take acetaminophen if you have liver disease.  1000 mg methocarbamol every 8 hours for the next 5-7 days for associated spasms and tightness  Over-the-counter lidocaine-containing patches can be applied to the area of pain for 12 hours at a time Apply/rub diclofenac sodium gel on muscles until it feels dry every 6-8 hours, then can use heating pad for 20-30 mins to help absorb Rest for the next 48 hours to avoid further injury Transition back into daily activity slowly to avoid reinjury  Symptoms should improve over the next 48-72 hours and slowly resolve over a course of 7-10 days   Follow up with primary care doctor in 7-10 days if symptoms not improving   Return to the ER if your pain worsens, you develop abdominal pain, urinary symptoms, changes to your bowel movements, numbness in your groin, loss of bladder or bowel control, loss of sensation or heaviness or weakness to your extremities, fevers

## 2018-10-29 NOTE — ED Notes (Signed)
Patient transported to X-ray 

## 2018-10-29 NOTE — ED Provider Notes (Signed)
[facility] DEPT Provider Note   CSN: [id number] Arrival date & time: 10/29/18  1830     History   Chief Complaint Chief Complaint  Patient presents with  . Back Pain    HPI John Mckee is a 33 y.o. male presents to the ER for evaluation of back pain.  Onset 2 months ago.  States he does a lot of heavy lifting and twisting at work during the week.  2 weeks ago he bent down to pick up something heavy and felt a pop down the middle of his lumbar spine.  For several days he had low back pain especially with movement and bending forward but he was able to manage it with over-the-counter ibuprofen and Tylenol with some occasional breakthrough pain.  Today he was playing basketball and bent down and had "catching" in the middle of his back that radiates to the right side, right buttock.  The pain is mild, constant but significantly worse with bending forward, sitting on his right buttock/side, trunk movements.  He has some tingling in his right buttock.  No known previous back injuries, surgeries or recent procedures or injections.  No history of kidney stones.  No hematuria, dysuria, urinary changes.  No associated saddle anesthesia, loss of sensation or weakness to his extremities.  No modifying factors.      HPI  Past Medical History:  Diagnosis Date  . Asthma     There are no active problems to display for this patient.   Past Surgical History:  Procedure Laterality Date  . APPENDECTOMY          Home Medications    Prior to Admission medications   Medication Sig Start Date End Date Taking? Authorizing Provider  benzonatate (TESSALON) 100 MG capsule Take 1 capsule (100 mg total) by mouth 3 (three) times daily as needed for cough. 03/05/18   Orpah Greek, MD  cyclobenzaprine (FLEXERIL) 10 MG tablet Take 1 tablet (10 mg total) by mouth 2 (two) times daily as needed for muscle spasms. 03/26/18   Ashley Murrain, NP  Diclofenac Sodium 3 %  GEL Place 2 g onto the skin 4 (four) times daily. 10/29/18   Kinnie Feil, PA-C  lidocaine (LIDODERM) 5 % Place 1 patch onto the skin daily. Remove & Discard patch within 12 hours or as directed by MD 04/24/18   Hedges, Dellis Filbert, PA-C  methocarbamol (ROBAXIN) 500 MG tablet Take 2 tablets (1,000 mg total) by mouth 3 (three) times daily for 7 days. 10/29/18 11/05/18  Kinnie Feil, PA-C  naproxen (NAPROSYN) 500 MG tablet Take 1 tablet (500 mg total) by mouth 2 (two) times daily. 10/29/18   Kinnie Feil, PA-C  predniSONE (DELTASONE) 20 MG tablet Take 2 tablets (40 mg total) by mouth daily for 7 days. 10/29/18 11/05/18  Kinnie Feil, PA-C    Family History Family History  Problem Relation Age of Onset  . Healthy Mother   . Healthy Father     Social History Social History   Tobacco Use  . Smoking status: Never Smoker  . Smokeless tobacco: Never Used  Substance Use Topics  . Alcohol use: Yes  . Drug use: Yes    Types: Marijuana     Allergies   Patient has no known allergies.   Review of Systems Review of Systems  Musculoskeletal: Positive for back pain and gait problem.  All other systems reviewed and are negative.    Physical Exam Updated Vital  Signs BP 121/74 (BP Location: Right Arm)   Pulse 84   Temp 99.6 F (37.6 C) (Oral)   Resp 18   SpO2 98%   Physical Exam Constitutional:      General: He is not in acute distress.    Appearance: He is well-developed.  HENT:     Head: Normocephalic and atraumatic.     Nose: Nose normal.  Neck:     Comments: c-spine: no midline or paraspinal tenderness Cardiovascular:     Rate and Rhythm: Normal rate.     Pulses:          Radial pulses are 2+ on the right side and 2+ on the left side.       Dorsalis pedis pulses are 2+ on the right side and 2+ on the left side.     Heart sounds: Normal heart sounds.  Pulmonary:     Effort: Pulmonary effort is normal.     Breath sounds: Normal breath sounds.   Abdominal:     Palpations: Abdomen is soft.     Tenderness: There is no abdominal tenderness.     Comments: No suprapubic or CVA tenderness   Musculoskeletal:        General: Tenderness present.     Lumbar back: He exhibits tenderness and pain.     Comments: T-spine: no midline or paraspinal tenderness L-spine: no midline or paraspinal tenderness.  Mild R SI and upper buttock tenderness.  No sciatic notch tenderness. Negative SLR bilaterally.  Pelvis: no pain or crepitus with IR/ER/downward pressure of hips bilaterally. No AP/L instability noted with compression. No leg shortening or rotation.    Skin:    General: Skin is warm and dry.     Capillary Refill: Capillary refill takes less than 2 seconds.     Comments: No overlaying rash to back   Neurological:     Mental Status: He is alert.     Sensory: No sensory deficit.     Comments: Can lift and hold legs without unilateral weakness or drift 5/5 strength with flexion/extension of hip, knee and ankle, bilaterally.  Sensation to light touch intact in lower extremities including feet  Psychiatric:        Behavior: Behavior normal.        Thought Content: Thought content normal.      ED Treatments / Results  Labs (all labs ordered are listed, but only abnormal results are displayed) Labs Reviewed - No data to display  EKG None  Radiology Dg Lumbar Spine 2-3 Views  Result Date: 10/29/2018 CLINICAL DATA:  Pop low back pain EXAM: LUMBAR SPINE - 2-3 VIEW COMPARISON:  CT 05/20/2007 FINDINGS: There is no evidence of lumbar spine fracture. Alignment is normal. Intervertebral disc spaces are maintained. Clips in the right lower quadrant IMPRESSION: Negative. Electronically Signed   By: Jasmine PangKim  Fujinaga M.D.   On: 10/29/2018 20:16   Dg Hip Unilat W Or Wo Pelvis 2-3 Views Right  Result Date: 10/29/2018 CLINICAL DATA:  Hip pain EXAM: DG HIP (WITH OR WITHOUT PELVIS) 2-3V RIGHT COMPARISON:  None. FINDINGS: There is no evidence of hip  fracture or dislocation. There is no evidence of arthropathy or other focal bone abnormality. Metallic zipper over the lateral proximal thigh soft tissues. IMPRESSION: Negative. Electronically Signed   By: Jasmine PangKim  Fujinaga M.D.   On: 10/29/2018 20:17    Procedures Procedures (including critical care time)  Medications Ordered in ED Medications  acetaminophen (TYLENOL) tablet 1,000 mg (1,000 mg Oral Given  10/29/18 2009)  naproxen (NAPROSYN) tablet 500 mg (500 mg Oral Given 10/29/18 2009)  methocarbamol (ROBAXIN) tablet 1,000 mg (1,000 mg Oral Given 10/29/18 2010)     Initial Impression / Assessment and Plan / ED Course  I have reviewed the triage vital signs and the nursing notes.  Pertinent labs & imaging results that were available during my care of the patient were reviewed by me and considered in my medical decision making (see chart for details).  EMR reviewed. Has been in ER several times for right sided back pain vs sciatica.  Highest on ddx is muscular strain vs spasm.    MSK shows mild R SI joint and upper buttock tenderness.  Strength is intact.  Sensation intact. Negative SLR.  Abdominal exam benign, without pulsatility, suprapubic or CVA tenderness. Distal pulses symmetric bilaterally. No overlaying rash.   No red flag features of back pain present such as saddle anesthesia, bladder/bowel incontinence or retention, fevers, preceding trauma or falls, neuro deficits, urinary symptoms.   Considered UTI/pyelo, kidney stone, cauda equina, epidural abscess, dissection highly unlikely as these don't fit clinical picture.   Given popping sensation and heavy lifting, x-rays obtained. Reviewed by me and radiologist, negative.   He was given oral symptomatic tx.  Reevaluated patient and no decline. Felt better.   Will dc with muscle relaxer, high dose NSAIDs.  Will also give prednisone, although I doubt sciatica/radicular etiology given exam.  Recommended f/u with PCP for persistent or  recurrent symptoms.  ED return precautions discussed with patient who verbalized understanding and is agreeable to plan.    Final Clinical Impressions(s) / ED Diagnoses   Final diagnoses:  Soft tissue injury of lower back, initial encounter    ED Discharge Orders         Ordered    predniSONE (DELTASONE) 20 MG tablet  Daily     10/29/18 2030    naproxen (NAPROSYN) 500 MG tablet  2 times daily     10/29/18 2030    methocarbamol (ROBAXIN) 500 MG tablet  3 times daily     10/29/18 2030    Diclofenac Sodium 3 % GEL  4 times daily     10/29/18 2030           Jerrell Mylar 10/29/18 2031    Vanetta Mulders, MD 11/05/18 [phone]

## 2018-11-24 ENCOUNTER — Other Ambulatory Visit: Payer: Self-pay

## 2018-11-24 ENCOUNTER — Encounter (HOSPITAL_BASED_OUTPATIENT_CLINIC_OR_DEPARTMENT_OTHER): Payer: Self-pay | Admitting: *Deleted

## 2018-11-24 ENCOUNTER — Emergency Department (HOSPITAL_BASED_OUTPATIENT_CLINIC_OR_DEPARTMENT_OTHER)
Admission: EM | Admit: 2018-11-24 | Discharge: 2018-11-24 | Disposition: A | Discharge status: Home / Self Care | Payer: Self-pay | Attending: Emergency Medicine | Admitting: Emergency Medicine

## 2018-11-24 DIAGNOSIS — J45909 Unspecified asthma, uncomplicated: Secondary | ICD-10-CM | POA: Insufficient documentation

## 2018-11-24 DIAGNOSIS — M545 Low back pain, unspecified: Secondary | ICD-10-CM

## 2018-11-24 NOTE — Discharge Instructions (Signed)
You were evaluated in the Emergency Department and after careful evaluation, we did not find any emergent condition requiring admission or further testing in the hospital.  Your exam/testing today was overall reassuring.  We encourage you to use Tylenol or Motrin throughout the day for discomfort.  We also advise that you rest your back as much as you can to allow it to heal.  Please return to the Emergency Department if you experience any worsening of your condition.  We encourage you to follow up with a primary care provider.  Thank you for allowing [place] to be a part of your care.

## 2018-11-24 NOTE — ED Provider Notes (Signed)
[facility] Emergency Department Provider Note MRN:  [id number]  Arrival date & time: 11/24/18     Chief Complaint   Back Pain   History of Present Illness   John Mckee is a 33 y.o. year-old male with a history of asthma presenting to the ED with chief complaint of back pain.  Location: Bilateral lumbar back Duration: 2 weeks Onset: Gradual Timing: Constant Description: Dull Severity: Moderate Exacerbating/Alleviating Factors: Worse when lifting heavy objects at work Associated Symptoms: None Pertinent Negatives: Denies fever, no IV drug use, no chest pain, no shortness of breath, no numbness weakness to the arms or legs, no bowel or bladder dysfunction.   Review of Systems  A complete 10 system review of systems was obtained and all systems are negative except as noted in the HPI and PMH.   Patient's Health History    Past Medical History:  Diagnosis Date  . Asthma     Past Surgical History:  Procedure Laterality Date  . APPENDECTOMY      Family History  Problem Relation Age of Onset  . Healthy Mother   . Healthy Father     Social History   Socioeconomic History  . Marital status: Single    Spouse name: Not on file  . Number of children: Not on file  . Years of education: Not on file  . Highest education level: Not on file  Occupational History  . Not on file  Social Needs  . Financial resource strain: Not on file  . Food insecurity    Worry: Not on file    Inability: Not on file  . Transportation needs    Medical: Not on file    Non-medical: Not on file  Tobacco Use  . Smoking status: Never Smoker  . Smokeless tobacco: Never Used  Substance and Sexual Activity  . Alcohol use: Yes  . Drug use: Yes    Types: Marijuana  . Sexual activity: Yes    Birth control/protection: None  Lifestyle  . Physical activity    Days per week: Not on file    Minutes per session: Not on file  . Stress: Not on file  Relationships   . Social Herbalist on phone: Not on file    Gets together: Not on file    Attends religious service: Not on file    Active member of club or organization: Not on file    Attends meetings of clubs or organizations: Not on file    Relationship status: Not on file  . Intimate partner violence    Fear of current or ex partner: Not on file    Emotionally abused: Not on file    Physically abused: Not on file    Forced sexual activity: Not on file  Other Topics Concern  . Not on file  Social History Narrative  . Not on file     Physical Exam  Vital Signs and Nursing Notes reviewed Vitals:   11/24/18 2054  BP: 127/70  Pulse: 95  Resp: 20  Temp: 98.2 F (36.8 C)  SpO2: 100%    CONSTITUTIONAL: Well-appearing, NAD NEURO:  Alert and oriented x 3, no focal deficits EYES:  eyes equal and reactive ENT/NECK:  no LAD, no JVD CARDIO: Regular rate, well-perfused, normal S1 and S2 PULM:  CTAB no wheezing or rhonchi GI/GU:  normal bowel sounds, non-distended, non-tender MSK/SPINE:  No gross deformities, no edema SKIN:  no rash, atraumatic PSYCH:  Appropriate speech and behavior  Diagnostic and Interventional Summary    EKG Interpretation  Date/Time:    Ventricular Rate:    PR Interval:    QRS Duration:   QT Interval:    QTC Calculation:   R Axis:     Text Interpretation:        Labs Reviewed - No data to display  No orders to display    Medications - No data to display   Procedures  /  Critical Care Procedures  ED Course and Medical Decision Making  I have reviewed the triage vital signs and the nursing notes.  Pertinent labs & imaging results that were available during my care of the patient were reviewed by me and considered in my medical decision making (see below for details).     No red flag symptoms to suggest myelopathy, well-appearing, normal neurological exam, normal vital signs, no fever, no IV drug use, suspect continued sprain or strain related  to his work.  Advise rest but patient is requesting a work saying that he can go back to work.  Advised NSAIDs.    Elmer Sow. Pilar Plate, MD [facility] [email or web address]  Final Clinical Impressions(s) / ED Diagnoses     ICD-10-CM   1. Acute bilateral low back pain without sciatica  M54.5     ED Discharge Orders    None       Discharge Instructions Discussed with and Provided to Patient:     Discharge Instructions     You were evaluated in the Emergency Department and after careful evaluation, we did not find any emergent condition requiring admission or further testing in the hospital.  Your exam/testing today was overall reassuring.  We encourage you to use Tylenol or Motrin throughout the day for discomfort.  We also advise that you rest your back as much as you can to allow it to heal.  Please return to the Emergency Department if you experience any worsening of your condition.  We encourage you to follow up with a primary care provider.  Thank you for allowing [place] to be a part of your care.       Sabas Sous, MD 11/24/18 2147

## 2018-11-24 NOTE — ED Triage Notes (Signed)
Lower back pain x 2 weeks. States he does a lot of heavy lifting at work. States he needs a work note to go back to work.

## 2018-12-07 ENCOUNTER — Emergency Department (HOSPITAL_BASED_OUTPATIENT_CLINIC_OR_DEPARTMENT_OTHER)
Admission: EM | Admit: 2018-12-07 | Discharge: 2018-12-07 | Disposition: A | Discharge status: Home / Self Care | Payer: Self-pay | Attending: Emergency Medicine | Admitting: Emergency Medicine

## 2018-12-07 ENCOUNTER — Encounter (HOSPITAL_BASED_OUTPATIENT_CLINIC_OR_DEPARTMENT_OTHER): Payer: Self-pay | Admitting: Emergency Medicine

## 2018-12-07 ENCOUNTER — Other Ambulatory Visit: Payer: Self-pay

## 2018-12-07 DIAGNOSIS — Z5321 Procedure and treatment not carried out due to patient leaving prior to being seen by health care provider: Secondary | ICD-10-CM | POA: Insufficient documentation

## 2018-12-07 DIAGNOSIS — K6289 Other specified diseases of anus and rectum: Secondary | ICD-10-CM | POA: Insufficient documentation

## 2018-12-07 NOTE — ED Notes (Signed)
In waiting room to pick up patient.  No longer waiting.  He did not notify anyone that he was leaving.

## 2018-12-07 NOTE — ED Triage Notes (Addendum)
Patient states that he does a lot of heavy lifting at work and started to notices that he has some protrusions from his rectal area. He was treating them OTC and they are getting better - patient states that he had to call out from work on Friday and he needs a note to go back to work

## 2018-12-08 ENCOUNTER — Encounter (HOSPITAL_BASED_OUTPATIENT_CLINIC_OR_DEPARTMENT_OTHER): Payer: Self-pay | Admitting: *Deleted

## 2018-12-08 ENCOUNTER — Emergency Department (HOSPITAL_BASED_OUTPATIENT_CLINIC_OR_DEPARTMENT_OTHER)
Admission: EM | Admit: 2018-12-08 | Discharge: 2018-12-08 | Disposition: A | Discharge status: Home / Self Care | Payer: Self-pay | Attending: Emergency Medicine | Admitting: Emergency Medicine

## 2018-12-08 ENCOUNTER — Other Ambulatory Visit: Payer: Self-pay

## 2018-12-08 DIAGNOSIS — K644 Residual hemorrhoidal skin tags: Secondary | ICD-10-CM

## 2018-12-08 DIAGNOSIS — Z79899 Other long term (current) drug therapy: Secondary | ICD-10-CM | POA: Insufficient documentation

## 2018-12-08 DIAGNOSIS — J45909 Unspecified asthma, uncomplicated: Secondary | ICD-10-CM | POA: Insufficient documentation

## 2018-12-08 DIAGNOSIS — K649 Unspecified hemorrhoids: Secondary | ICD-10-CM | POA: Insufficient documentation

## 2018-12-08 DIAGNOSIS — Z791 Long term (current) use of non-steroidal anti-inflammatories (NSAID): Secondary | ICD-10-CM | POA: Insufficient documentation

## 2018-12-08 NOTE — Discharge Instructions (Addendum)
Apply hydrocortisone cream or ointment twice a day.

## 2018-12-08 NOTE — ED Triage Notes (Signed)
States he needs a note to be out of work for a day or 2 due to hemorrhoid.

## 2018-12-08 NOTE — ED Provider Notes (Signed)
[facility] EMERGENCY DEPARTMENT Provider Note   CSN: [id number] Arrival date & time: 12/08/18  2204    History   Chief Complaint Chief Complaint  Patient presents with  . Hemorrhoids    HPI John HAGGART is a 33 y.o. male.   The history is provided by the patient.  He complains of a lump in his anal area which he noted 5 days ago.  He has been using some hemorrhoid cream on it, and it is not as swollen as before.  Initially was mildly painful, but it is not painful now.  He denies any itching or bleeding.  He denies prior history of hemorrhoids.  His girlfriend had looked at it and thought it was a hemorrhoid.  Past Medical History:  Diagnosis Date  . Asthma     There are no active problems to display for this patient.   Past Surgical History:  Procedure Laterality Date  . APPENDECTOMY          Home Medications    Prior to Admission medications   Medication Sig Start Date End Date Taking? Authorizing Provider  benzonatate (TESSALON) 100 MG capsule Take 1 capsule (100 mg total) by mouth 3 (three) times daily as needed for cough. 03/05/18   Gilda Crease, MD  cyclobenzaprine (FLEXERIL) 10 MG tablet Take 1 tablet (10 mg total) by mouth 2 (two) times daily as needed for muscle spasms. 03/26/18   Janne Napoleon, NP  Diclofenac Sodium 3 % GEL Place 2 g onto the skin 4 (four) times daily. 10/29/18   Liberty Handy, PA-C  lidocaine (LIDODERM) 5 % Place 1 patch onto the skin daily. Remove & Discard patch within 12 hours or as directed by MD 04/24/18   Hedges, Tinnie Gens, PA-C  naproxen (NAPROSYN) 500 MG tablet Take 1 tablet (500 mg total) by mouth 2 (two) times daily. 10/29/18   Liberty Handy, PA-C    Family History Family History  Problem Relation Age of Onset  . Healthy Mother   . Healthy Father     Social History Social History   Tobacco Use  . Smoking status: Never Smoker  . Smokeless tobacco: Never Used  Substance Use Topics  .  Alcohol use: Yes  . Drug use: Yes    Types: Marijuana     Allergies   Patient has no known allergies.   Review of Systems Review of Systems  All other systems reviewed and are negative.    Physical Exam Updated Vital Signs BP 125/69   Pulse 80   Temp 98.2 F (36.8 C) (Oral)   Resp 18   Ht 6' (1.829 m)   Wt 68 kg   SpO2 100%   BMI 20.33 kg/m   Physical Exam Vitals signs and nursing note reviewed.    33 year old male, resting comfortably and in no acute distress. Vital signs are normal. Oxygen saturation is 100%, which is normal. Head is normocephalic and atraumatic. PERRLA, EOMI. Oropharynx is clear. Neck is nontender and supple without adenopathy or JVD. Back is nontender and there is no CVA tenderness. Lungs are clear without rales, wheezes, or rhonchi. Chest is nontender. Heart has regular rate and rhythm without murmur. Abdomen is soft, flat, nontender without masses or hepatosplenomegaly and peristalsis is normoactive. Rectal: Small external hemorrhoid present on the left without evidence of thrombosis. Extremities have no cyanosis or edema, full range of motion is present. Skin is warm and dry without rash. Neurologic: Mental status is  normal, cranial nerves are intact, there are no motor or sensory deficits.  ED Treatments / Results   Procedures Procedures  Medications Ordered in ED Medications - No data to display   Initial Impression / Assessment and Plan / ED Course  I have reviewed the triage vital signs and the nursing notes.  Uncomplicated external hemorrhoid.  He is advised to use over-the-counter hydrocortisone cream or ointment as needed.  He is requesting a note to return to work, and this is provided.  Old records are reviewed, and he has no relevant past visits.  Final Clinical Impressions(s) / ED Diagnoses   Final diagnoses:  External hemorrhoid    ED Discharge Orders    None       Delora Fuel, MD 28/76/81 2310

## 2019-01-06 ENCOUNTER — Emergency Department (HOSPITAL_COMMUNITY)
Admission: EM | Admit: 2019-01-06 | Discharge: 2019-01-06 | Disposition: A | Discharge status: Home / Self Care | Payer: Self-pay | Attending: Emergency Medicine | Admitting: Emergency Medicine

## 2019-01-06 ENCOUNTER — Emergency Department (HOSPITAL_COMMUNITY): Payer: Self-pay

## 2019-01-06 ENCOUNTER — Encounter (HOSPITAL_COMMUNITY): Payer: Self-pay | Admitting: Emergency Medicine

## 2019-01-06 ENCOUNTER — Other Ambulatory Visit: Payer: Self-pay

## 2019-01-06 DIAGNOSIS — S62306A Unspecified fracture of fifth metacarpal bone, right hand, initial encounter for closed fracture: Secondary | ICD-10-CM | POA: Insufficient documentation

## 2019-01-06 DIAGNOSIS — Y929 Unspecified place or not applicable: Secondary | ICD-10-CM | POA: Insufficient documentation

## 2019-01-06 DIAGNOSIS — Y999 Unspecified external cause status: Secondary | ICD-10-CM | POA: Insufficient documentation

## 2019-01-06 DIAGNOSIS — J45909 Unspecified asthma, uncomplicated: Secondary | ICD-10-CM | POA: Insufficient documentation

## 2019-01-06 DIAGNOSIS — Y939 Activity, unspecified: Secondary | ICD-10-CM | POA: Insufficient documentation

## 2019-01-06 DIAGNOSIS — W231XXA Caught, crushed, jammed, or pinched between stationary objects, initial encounter: Secondary | ICD-10-CM | POA: Insufficient documentation

## 2019-01-06 MED ORDER — IBUPROFEN 800 MG PO TABS: 800.0000 mg | ORAL_TABLET | Freq: Three times a day (TID) | ORAL | 0 refills | Status: DC

## 2019-01-06 NOTE — ED Provider Notes (Signed)
[place] DEPT Provider Note   CSN: [id number] Arrival date & time: 01/06/19  2007     History Chief Complaint  Patient presents with  . Hand Injury    John Mckee is a 33 y.o. male with a past medical history significant for asthma who presents to the ED due to sudden onset of right hand pain.  Patient states he was moving a washer machine with his sister last night downstairs when his right hand got pinned between the washer and the wall. Patient admits to sudden pain that has worsened over the past 24 hours. Right hand pain is associated with edema and decreased ROM. Patient notes he has fractured this hand numerous times in the past, but has never follow-up with an orthopedic doctor. He has tried ibuprofen and ice with moderate relief. He rates his a pain a 5/10, worse with movement. Patient denies numbness/tingling. Patient denies further injuries.     Past Medical History:  Diagnosis Date  . Asthma     There are no problems to display for this patient.   Past Surgical History:  Procedure Laterality Date  . APPENDECTOMY         Family History  Problem Relation Age of Onset  . Healthy Mother   . Healthy Father     Social History   Tobacco Use  . Smoking status: Never Smoker  . Smokeless tobacco: Never Used  Substance Use Topics  . Alcohol use: Yes  . Drug use: Yes    Types: Marijuana    Home Medications Prior to Admission medications   Medication Sig Start Date End Date Taking? Authorizing Provider  Diclofenac Sodium 3 % GEL Place 2 g onto the skin 4 (four) times daily. 10/29/18   Kinnie Feil, PA-C  ibuprofen (ADVIL) 800 MG tablet Take 1 tablet (800 mg total) by mouth 3 (three) times daily. 01/06/19   Suzy Bouchard, PA-C    Allergies    Patient has no known allergies.  Review of Systems   Review of Systems  Constitutional: Negative for chills and fever.  Respiratory: Negative for shortness of breath.     Cardiovascular: Negative for chest pain.  Gastrointestinal: Negative for abdominal pain.  Musculoskeletal: Positive for arthralgias and myalgias.  Neurological: Negative for weakness and numbness.    Physical Exam Updated Vital Signs BP (!) 126/95   Pulse 92   Temp 97.8 F (36.6 C) (Oral)   Resp 16   SpO2 99%   Physical Exam Vitals and nursing note reviewed.  Constitutional:      General: He is not in acute distress.    Appearance: He is not ill-appearing.  HENT:     Head: Normocephalic.  Eyes:     Conjunctiva/sclera: Conjunctivae normal.  Cardiovascular:     Rate and Rhythm: Normal rate and regular rhythm.     Pulses: Normal pulses.     Heart sounds: Normal heart sounds. No murmur. No friction rub. No gallop.   Pulmonary:     Effort: Pulmonary effort is normal.     Breath sounds: Normal breath sounds.  Abdominal:     General: Abdomen is flat. There is no distension.     Palpations: Abdomen is soft.     Tenderness: There is no abdominal tenderness. There is no guarding or rebound.  Musculoskeletal:     Cervical back: Neck supple.     Comments: Tenderness to palpation over dorsal aspect on the ulnar side of right hand  with overlying edema. Decreased ROM of fingers 3-5 due to pain. No snuffbox tenderness. Normal wrist with no tenderness and full ROM. Radial pulse intact. Sensation intact.   Skin:    General: Skin is warm.  Neurological:     General: No focal deficit present.     Mental Status: He is alert.     ED Results / Procedures / Treatments   Labs (all labs ordered are listed, but only abnormal results are displayed) Labs Reviewed - No data to display  EKG None  Radiology DG Hand Complete Right  Result Date: 01/06/2019 CLINICAL DATA:  Pain EXAM: RIGHT HAND - COMPLETE 3+ VIEW COMPARISON:  August 02, 2016 FINDINGS: There is an acute angulated fracture involving the distal aspect of the fifth metacarpal. There is an old healed fracture of the fourth  metacarpal with volar angulation. IMPRESSION: Acute fracture of the fifth metacarpal. Old healed fracture of the fourth metacarpal. Electronically Signed   By: Katherine Mantle M.D.   On: 01/06/2019 20:49    Procedures Procedures (including critical care time)  Medications Ordered in ED Medications - No data to display  ED Course  I have reviewed the triage vital signs and the nursing notes.  Pertinent labs & imaging results that were available during my care of the patient were reviewed by me and considered in my medical decision making (see chart for details).    MDM Rules/Calculators/A&P                     33 year old male presents to the ED due to right hand pain after pinning it between a washer machine and wall as he was moving it down a staircase. Stable vitals. Patient in no acute distress and non-ill appearing. Tenderness to palpation over dorsal aspect of right hand overlying 3rd-5th metacarpals with edema. Limited ROM of fingers 3-5 due to pain. Neurovascularly intact. X-ray personally reviewed which is significant for a fracture of the 5th metacarpal with old healed 4th metacarpal fracture. Patient placed in ulnar gutter splint. Orthopedics number given at discharge. Patient advised to call tomorrow to schedule an appointment with Dr. Ophelia Charter. Patient advised to take over the counter ibuprofen or tylenol as needed for pain. Strict ED precautions discussed with patient. Patient states understanding and agrees to plan. Patient discharged home in no acute distress and stable vitals  Final Clinical Impression(s) / ED Diagnoses Final diagnoses:  Closed nondisplaced fracture of fifth metacarpal bone of right hand, unspecified portion of metacarpal, initial encounter    Rx / DC Orders ED Discharge Orders         Ordered    ibuprofen (ADVIL) 800 MG tablet  3 times daily     01/06/19 2146           Mannie Stabile, PA-C 01/07/19 0124    Rolan Bucco, MD 01/07/19  [phone]

## 2019-01-06 NOTE — Discharge Instructions (Addendum)
As dicussed, your x-ray showed a fracture of your 5th metacarpal. I have placed you in a splint. Keep splint on until you see the orthopedic doctor. Call tomorrow to schedule an appointment with Dr. Lorin Mercy. His number is included in your discharge papers. I have written you a prescription for ibuprofen as needed for pain. Return to the ER for any new or worsening symptoms.

## 2019-01-06 NOTE — ED Triage Notes (Signed)
Patient reports injury to right hand moving furniture last night. Swelling noted to hand. States hx fractures to same hand. Requesting work note.

## 2019-01-13 ENCOUNTER — Ambulatory Visit: Payer: Self-pay | Admitting: Orthopaedic Surgery

## 2019-01-15 ENCOUNTER — Encounter: Payer: Self-pay | Admitting: Surgery

## 2019-01-15 ENCOUNTER — Other Ambulatory Visit: Payer: Self-pay

## 2019-01-15 ENCOUNTER — Ambulatory Visit: Payer: Self-pay

## 2019-01-15 ENCOUNTER — Ambulatory Visit (INDEPENDENT_AMBULATORY_CARE_PROVIDER_SITE_OTHER): Payer: Self-pay | Admitting: Surgery

## 2019-01-15 DIAGNOSIS — S62336A Displaced fracture of neck of fifth metacarpal bone, right hand, initial encounter for closed fracture: Secondary | ICD-10-CM

## 2019-01-15 DIAGNOSIS — M79641 Pain in right hand: Secondary | ICD-10-CM

## 2019-01-15 NOTE — Progress Notes (Signed)
   Office Visit Note   Patient: John Mckee           Date of Birth: 1985-03-19           MRN: [id number] Visit Date: 01/15/2019              Requested by: No referring provider defined for this encounter. PCP: John Mckee   Assessment & Plan: Visit Diagnoses:  1. Pain in right hand   2. Closed displaced fracture of neck of fifth metacarpal bone of right hand, initial encounter     Plan: Patient was put in a ulnar gutter splint.  We will follow-up with John Mckee next week to discuss treatment options.  May need hand surgery referral.  Follow-Up Instructions: Return for next week with John Mckee Mckee John Mckee to discuss treatment options for right hand fracture. .   Orders:  Orders Placed This Encounter  Procedures   XR Hand Complete Right   No orders of the defined types were placed in this encounter.     Procedures: No procedures performed   Clinical Data: No additional findings.   Subjective: No chief complaint on file.   HPI 34 year old black male comes in today with complaints of right hand pain.  States that he suffered an injury moving furniture December 27 22,020.  He has had previous issues with his right hand suffered 1/5 metacarpal neck fracture September 2007 and a right fourth metacarpal fracture July 2018 after he punched a wall.  Patient was seen in the [facility] long ED January 06, 2019 and x-rays of the hand which showed an acute fracture of the fifth metacarpal.  Old healed fracture of the fourth metacarpal. Review of Systems No current cardiopulmonary GI/GU issues  Objective: Vital Signs: There were no vitals taken for this visit.  Physical Exam HENT:     Head: Normocephalic and atraumatic.  Eyes:     Extraocular Movements: Extraocular movements intact.  Pulmonary:     Effort: No respiratory distress.  Musculoskeletal:     Comments: Does have swelling ulnar aspect of the right hand.  He is obviously tender over the fifth metacarpal fracture  site.  Neurological:     Mental Status: He is alert.   Ortho Exam  Specialty Comments:  No specialty comments available.  Imaging: No results found.   PMFS History: There are no problems to display for this patient.  Past Medical History:  Diagnosis Date   Asthma     Family History  Problem Relation Age of Onset   Healthy Mother    Healthy Father     Past Surgical History:  Procedure Laterality Date   APPENDECTOMY     Social History   Occupational History   Not on file  Tobacco Use   Smoking status: Never Smoker   Smokeless tobacco: Never Used  Substance and Sexual Activity   Alcohol use: Yes   Drug use: Yes    Types: Marijuana   Sexual activity: Yes    Birth control/protection: None

## 2019-01-19 ENCOUNTER — Ambulatory Visit: Payer: Self-pay | Admitting: Orthopedic Surgery

## 2019-01-19 ENCOUNTER — Encounter: Payer: Self-pay | Admitting: Orthopedic Surgery

## 2019-01-19 ENCOUNTER — Other Ambulatory Visit: Payer: Self-pay

## 2019-01-19 VITALS — Ht 71.0 in | Wt 150.0 lb

## 2019-01-19 DIAGNOSIS — S62336A Displaced fracture of neck of fifth metacarpal bone, right hand, initial encounter for closed fracture: Secondary | ICD-10-CM

## 2019-01-25 ENCOUNTER — Encounter: Payer: Self-pay | Admitting: Orthopedic Surgery

## 2019-01-25 NOTE — Progress Notes (Signed)
   Office Visit Note   Patient: John Mckee           Date of Birth: 1985-11-15           MRN: [id number] Visit Date: 01/19/2019 Requested by: No referring provider defined for this encounter. PCP: Patient, [name]  Subjective: Chief Complaint  Patient presents with  . Right Hand - Fracture    DOI 01/05/2019    HPI: Rennis Harding is a patient who underwent splinting for right hand fifth metacarpal fracture.  Has a history of fourth metacarpal fracture as well.  He is right-hand dominant.  He is not having a lot of pain unless he hits it on something.              ROS: All systems reviewed are negative as they relate to the chief complaint within the history of present illness.  Patient denies  fevers or chills.   Assessment & Plan: Visit Diagnoses:  1. Closed displaced fracture of neck of fifth metacarpal bone of right hand, initial encounter     Plan: Impression is well aligned relatively asymptomatic right hand metacarpal neck fracture #5.  Plan is stockinette plus removable wrist splint with no lifting of that right hand.  He is okay for range of motion.  No rotational deformity.  Come back in 2 and half weeks for repeat clinical assessment likely release at that time  Follow-Up Instructions: No follow-ups on file.   Orders:  No orders of the defined types were placed in this encounter.  No orders of the defined types were placed in this encounter.     Procedures: No procedures performed   Clinical Data: No additional findings.  Objective: Vital Signs: Ht 5\' 11"  (1.803 m)   Wt 150 lb (68 kg)   BMI 20.92 kg/m   Physical Exam:   Constitutional: Patient appears well-developed HEENT:  Head: Normocephalic Eyes:EOM are normal Neck: Normal range of motion Cardiovascular: Normal rate Pulmonary/chest: Effort normal Neurologic: Patient is alert Skin: Skin is warm Psychiatric: Patient has normal mood and affect    Ortho Exam: Ortho exam demonstrates mild  tenderness at the metacarpal neck #5 but no rotational deformity with finger flexion is 4 and 5.  Wrist range of motion is intact no snuffbox tenderness.  Mild swelling is present right hand compared to left skin is intact in that hand region  Specialty Comments:  No specialty comments available.  Imaging: No results found.   PMFS History: There are no problems to display for this patient.  Past Medical History:  Diagnosis Date  . Asthma     Family History  Problem Relation Age of Onset  . Healthy Mother   . Healthy Father     Past Surgical History:  Procedure Laterality Date  . APPENDECTOMY     Social History   Occupational History  . Not on file  Tobacco Use  . Smoking status: Never Smoker  . Smokeless tobacco: Never Used  Substance and Sexual Activity  . Alcohol use: Yes  . Drug use: Yes    Types: Marijuana  . Sexual activity: Yes    Birth control/protection: None

## 2019-02-02 ENCOUNTER — Ambulatory Visit: Payer: Self-pay | Admitting: Orthopedic Surgery

## 2020-02-01 IMAGING — CR DG HAND COMPLETE 3+V*R*
3 series · 3 of 3 positions shown · non-contrast
Comparison: August 02, 2016

CLINICAL DATA: Pain

EXAM:
RIGHT HAND - COMPLETE 3+ VIEW

[x hand pa right]
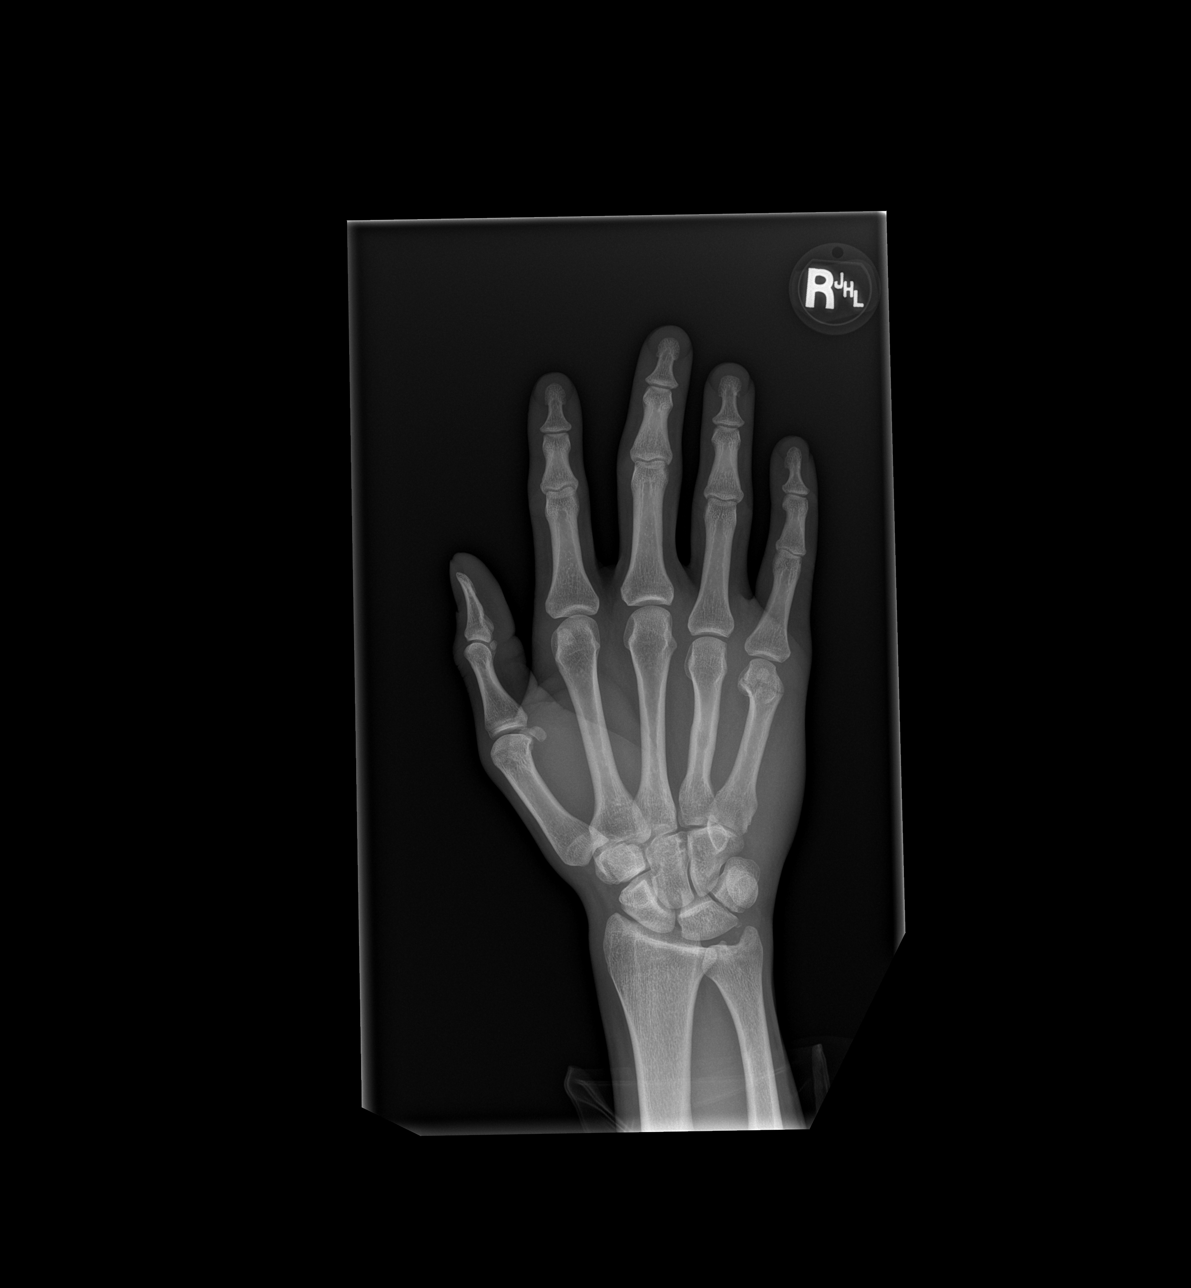

[x hand obl right]
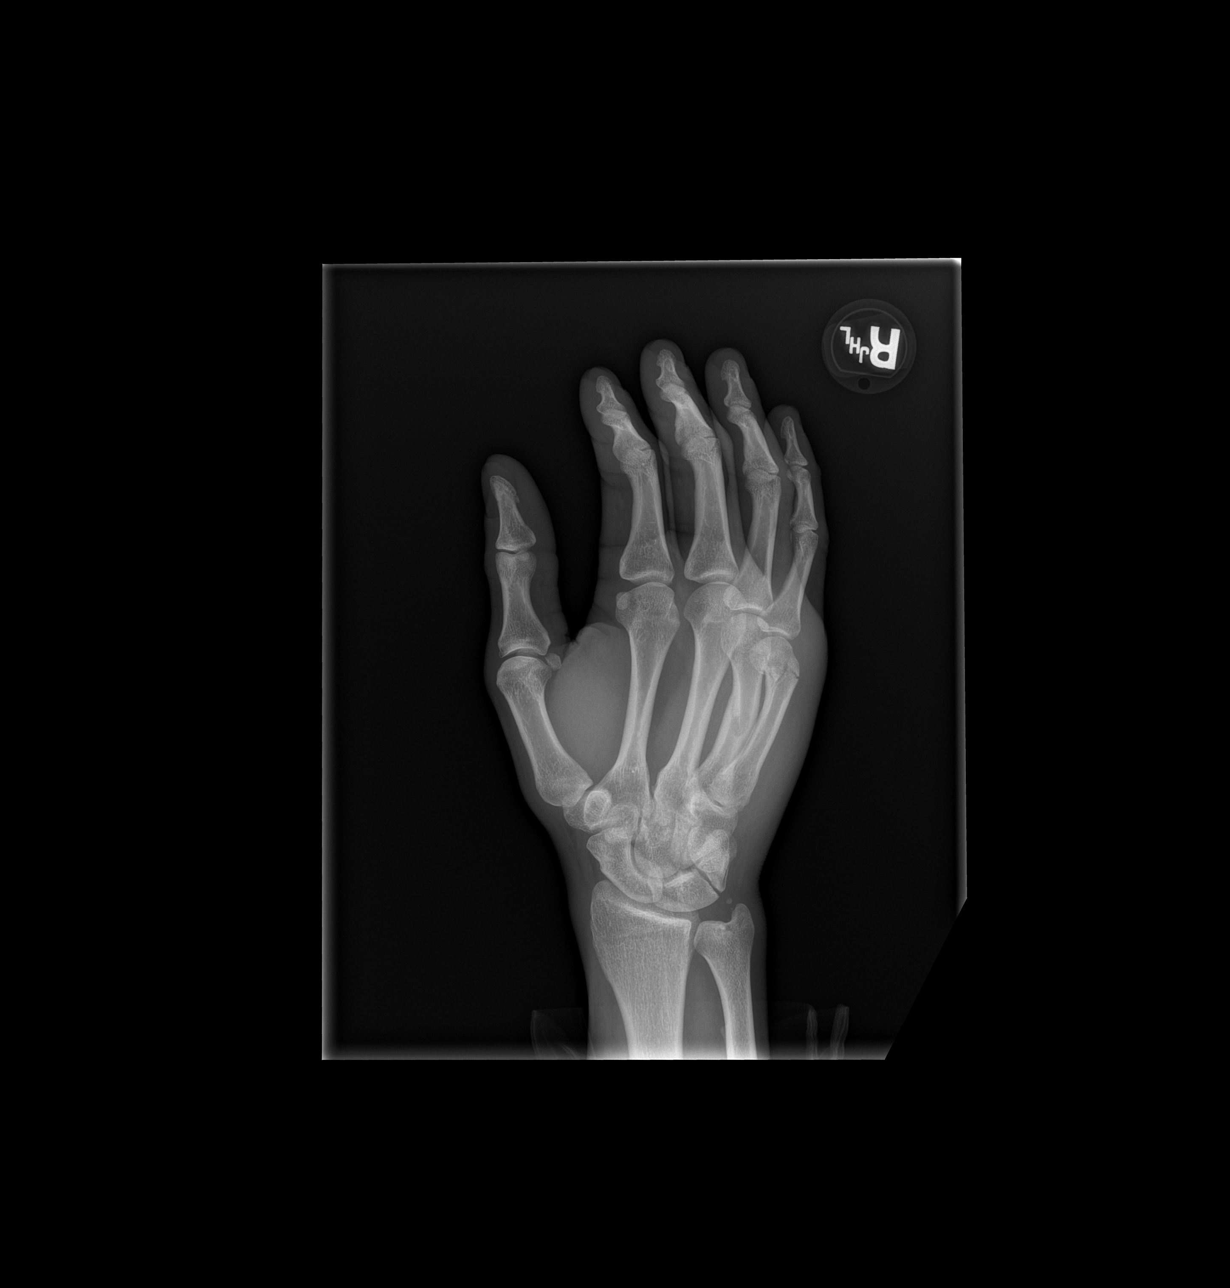

[x hand lat right]
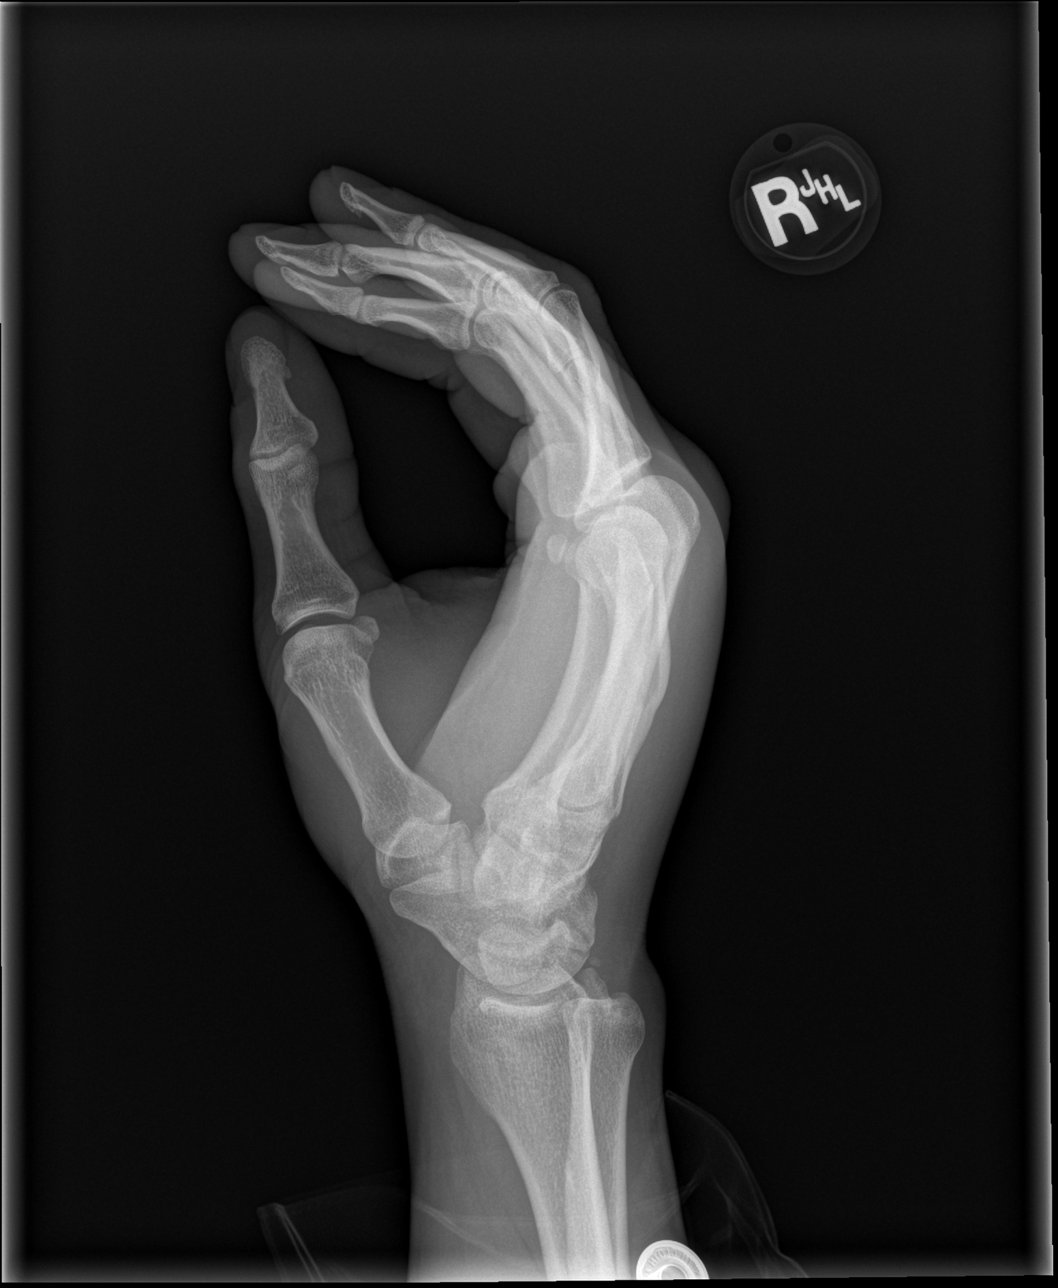

[3 of 3 positions shown; findings below may reference images not displayed]

FINDINGS: There is an acute angulated fracture involving the distal aspect of
the fifth metacarpal. There is an old healed fracture of the fourth
metacarpal with volar angulation.
IMPRESSION: Acute fracture of the fifth metacarpal. Old healed fracture of the
fourth metacarpal.

## 2020-10-29 ENCOUNTER — Other Ambulatory Visit: Payer: Self-pay

## 2020-10-29 ENCOUNTER — Encounter (HOSPITAL_BASED_OUTPATIENT_CLINIC_OR_DEPARTMENT_OTHER): Payer: Self-pay | Admitting: Emergency Medicine

## 2020-10-29 ENCOUNTER — Emergency Department (HOSPITAL_BASED_OUTPATIENT_CLINIC_OR_DEPARTMENT_OTHER)
Admission: EM | Admit: 2020-10-29 | Discharge: 2020-10-29 | Disposition: A | Discharge status: Home / Self Care | Payer: Self-pay | Attending: Emergency Medicine | Admitting: Emergency Medicine

## 2020-10-29 DIAGNOSIS — J45909 Unspecified asthma, uncomplicated: Secondary | ICD-10-CM | POA: Insufficient documentation

## 2020-10-29 DIAGNOSIS — U071 COVID-19: Secondary | ICD-10-CM | POA: Insufficient documentation

## 2020-10-29 DIAGNOSIS — J111 Influenza due to unidentified influenza virus with other respiratory manifestations: Secondary | ICD-10-CM

## 2020-10-29 LAB — RESP PANEL BY RT-PCR (FLU A&B, COVID) ARPGX2
Influenza A by PCR: NEGATIVE
Influenza B by PCR: NEGATIVE
SARS Coronavirus 2 by RT PCR: POSITIVE — AB

## 2020-10-29 MED ORDER — FLUTICASONE PROPIONATE 50 MCG/ACT NA SUSP: 2.0000 | Freq: Every day | NASAL | 0 refills | Status: DC

## 2020-10-29 MED ORDER — ALBUTEROL SULFATE HFA 108 (90 BASE) MCG/ACT IN AERS: 1.0000 | INHALATION_SPRAY | Freq: Four times a day (QID) | RESPIRATORY_TRACT | 0 refills | Status: AC | PRN

## 2020-10-29 MED ORDER — BENZONATATE 100 MG PO CAPS: 100.0000 mg | ORAL_CAPSULE | Freq: Three times a day (TID) | ORAL | 0 refills | Status: DC | PRN

## 2020-10-29 NOTE — Discharge Instructions (Signed)

## 2020-10-29 NOTE — ED Provider Notes (Signed)
Emergency Department Provider Note   I have reviewed the triage vital signs and the nursing notes.   HISTORY  Chief Complaint Generalized Body Aches   HPI John Mckee is a 35 y.o. male presents to the ED with flu-like symptoms for the last 5 days.  Patient describes cough and congestion with mild sore throat.  He is feeling body aches.  He is having fever at home.  He is not feeling particularly short of breath or having chest discomfort.  No nausea, vomiting, diarrhea.  No abdominal pain or back discomfort.  No urinary tract infection symptoms.  Denies known sick contacts.  He notes a remote history of asthma and is out of his inhaler although not appreciating any particular wheezing.  He felt like he had to miss work today due to his symptoms which ultimately prompted his ED evaluation.   Past Medical History:  Diagnosis Date   Asthma     There are no problems to display for this patient.   Past Surgical History:  Procedure Laterality Date   APPENDECTOMY      Allergies Patient has no known allergies.  Family History  Problem Relation Age of Onset   Healthy Mother    Healthy Father     Social History Social History   Tobacco Use   Smoking status: Never   Smokeless tobacco: Never  Vaping Use   Vaping Use: Never used  Substance Use Topics   Alcohol use: Yes   Drug use: Yes    Types: Marijuana    Review of Systems  Constitutional: Positive fever/chills Eyes: No visual changes. ENT: Positive sore throat. Cardiovascular: Denies chest pain.  Respiratory: Denies shortness of breath. Positive cough.  Gastrointestinal: No abdominal pain.  No nausea, no vomiting.  No diarrhea.  No constipation. Genitourinary: Negative for dysuria. Musculoskeletal: Negative for back pain. Positive diffuse body aches.  Skin: Negative for rash. Neurological: Negative for focal weakness or numbness. Positive HA.   10-point ROS otherwise  negative.  ____________________________________________   PHYSICAL EXAM:  VITAL SIGNS: ED Triage Vitals  Enc Vitals Group     BP 10/29/20 0100 107/87     Pulse Rate 10/29/20 0100 92     Resp 10/29/20 0100 18     Temp 10/29/20 0100 (!) 100.9 F (38.3 C)     Temp Source 10/29/20 0100 Oral     SpO2 10/29/20 0100 97 %     Weight 10/29/20 0056 149 lb 14.6 oz (68 kg)     Height 10/29/20 0056 5\' 11"  (1.803 m)   Constitutional: Alert and oriented. Well appearing and in no acute distress. Eyes: Conjunctivae are normal.  Head: Atraumatic. Nose: No congestion/rhinnorhea. Mouth/Throat: Mucous membranes are moist.  Oropharynx with mild erythema. No tonsillar hypertrophy or exudate. No PTA. Neck: No stridor.   Cardiovascular: Normal rate, regular rhythm. Good peripheral circulation. Grossly normal heart sounds.   Respiratory: Normal respiratory effort.  No retractions. Lungs CTAB. Gastrointestinal: No distention.  Musculoskeletal: No gross deformities of extremities. Neurologic:  Normal speech and language.  Skin:  Skin is warm, dry and intact. No rash noted.   ____________________________________________   LABS (all labs ordered are listed, but only abnormal results are displayed)  Labs Reviewed  RESP PANEL BY RT-PCR (FLU A&B, COVID) [id number]    ____________________________________________  RADIOLOGY  None   ____________________________________________   PROCEDURES  Procedure(s) performed:   Procedures  None  ____________________________________________   INITIAL IMPRESSION / ASSESSMENT AND PLAN / ED COURSE  Pertinent labs & imaging results that were available during my care of the patient were reviewed by me and considered in my medical decision making (see chart for details).   Patient presents to the emergency department with flulike symptoms for the past 5 days.  He is outside the window to benefit from Tamiflu.  Clinically suspect flu.  Will send COVID/flu PCR  test and patient will follow the test results in the MyChart app.  We will refill his albuterol and send in other supportive care medications.  Provided work note to allow for quarantine time and symptom mgmt. Lungs are CTABL. No wheezing. Doubt CAP clinically given exam. Patient is very well appearing.    ____________________________________________  FINAL CLINICAL IMPRESSION(S) / ED DIAGNOSES  Final diagnoses:  Influenza-like illness    NEW OUTPATIENT MEDICATIONS STARTED DURING THIS VISIT:  Discharge Medication List as of 10/29/2020  1:13 AM     START taking these medications   Details  albuterol (VENTOLIN HFA) 108 (90 Base) MCG/ACT inhaler Inhale 1-2 puffs into the lungs every 6 (six) hours as needed for wheezing or shortness of breath., Starting Sat 10/29/2020, Normal    benzonatate (TESSALON) 100 MG capsule Take 1 capsule (100 mg total) by mouth 3 (three) times daily as needed for cough., Starting Sat 10/29/2020, Normal    fluticasone (FLONASE) 50 MCG/ACT nasal spray Place 2 sprays into both nostrils daily for 7 days., Starting Sat 10/29/2020, Until Sat 11/05/2020, Normal        Note:  This document was prepared using Dragon voice recognition software and may include unintentional dictation errors.  Alona Bene, MD, [facility] Emergency Medicine    Yazmeen Woolf, Arlyss Repress, MD 10/29/20 [phone]

## 2020-10-29 NOTE — ED Triage Notes (Signed)
Pt c/o flu like symptoms, with body aches, cough, congestion, and fever x 5 days.

## 2021-09-30 ENCOUNTER — Encounter (HOSPITAL_COMMUNITY): Payer: Self-pay

## 2021-09-30 ENCOUNTER — Other Ambulatory Visit: Payer: Self-pay

## 2021-09-30 ENCOUNTER — Emergency Department (HOSPITAL_COMMUNITY)
Admission: EM | Admit: 2021-09-30 | Discharge: 2021-09-30 | Disposition: A | Discharge status: Home / Self Care | Payer: No Typology Code available for payment source | Attending: Emergency Medicine | Admitting: Emergency Medicine

## 2021-09-30 DIAGNOSIS — Y92410 Unspecified street and highway as the place of occurrence of the external cause: Secondary | ICD-10-CM | POA: Insufficient documentation

## 2021-09-30 DIAGNOSIS — S0993XA Unspecified injury of face, initial encounter: Secondary | ICD-10-CM | POA: Diagnosis present

## 2021-09-30 DIAGNOSIS — S00511A Abrasion of lip, initial encounter: Secondary | ICD-10-CM | POA: Insufficient documentation
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
## 2021-09-30 NOTE — Discharge Instructions (Signed)
Motrin and Tylenol as needed as directed. Recheck with your doctor as needed.
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
## 2021-12-08 ENCOUNTER — Emergency Department (HOSPITAL_BASED_OUTPATIENT_CLINIC_OR_DEPARTMENT_OTHER): Payer: 59

## 2021-12-08 ENCOUNTER — Emergency Department (HOSPITAL_BASED_OUTPATIENT_CLINIC_OR_DEPARTMENT_OTHER)
Admission: EM | Admit: 2021-12-08 | Discharge: 2021-12-08 | Disposition: A | Discharge status: Home / Self Care | Payer: 59 | Attending: Emergency Medicine | Admitting: Emergency Medicine

## 2021-12-08 ENCOUNTER — Encounter (HOSPITAL_BASED_OUTPATIENT_CLINIC_OR_DEPARTMENT_OTHER): Payer: Self-pay | Admitting: Emergency Medicine

## 2021-12-08 ENCOUNTER — Other Ambulatory Visit: Payer: Self-pay

## 2021-12-08 DIAGNOSIS — R109 Unspecified abdominal pain: Secondary | ICD-10-CM

## 2021-12-08 DIAGNOSIS — K820 Obstruction of gallbladder: Secondary | ICD-10-CM | POA: Diagnosis not present

## 2021-12-08 DIAGNOSIS — J45909 Unspecified asthma, uncomplicated: Secondary | ICD-10-CM | POA: Insufficient documentation

## 2021-12-08 DIAGNOSIS — R1012 Left upper quadrant pain: Secondary | ICD-10-CM | POA: Diagnosis not present

## 2021-12-08 DIAGNOSIS — R10A2 Flank pain, left side: Secondary | ICD-10-CM

## 2021-12-08 LAB — COMPREHENSIVE METABOLIC PANEL
ALT: 14 U/L (ref 0–44)
AST: 19 U/L (ref 15–41)
Albumin: 4 g/dL (ref 3.5–5.0)
Alkaline Phosphatase: 53 U/L (ref 38–126)
Anion gap: 7 (ref 5–15)
BUN: 13 mg/dL (ref 6–20)
CO2: 28 mmol/L (ref 22–32)
Calcium: 9.2 mg/dL (ref 8.9–10.3)
Chloride: 104 mmol/L (ref 98–111)
Creatinine, Ser: 1.06 mg/dL (ref 0.61–1.24)
GFR, Estimated: >60 mL/min (ref >60)
Glucose, Bld: 85 mg/dL (ref 70–99)
Potassium: 3.8 mmol/L (ref 3.5–5.1)
Sodium: 139 mmol/L (ref 135–145)
Total Bilirubin: 0.8 mg/dL (ref 0.3–1.2)
Total Protein: 7 g/dL (ref 6.5–8.1)

## 2021-12-08 LAB — CBC WITH DIFFERENTIAL/PLATELET
Abs Immature Granulocytes: 0.01 10*3/uL (ref 0.00–0.07)
Basophils Absolute: 0 10*3/uL (ref 0.0–0.1)
Basophils Relative: 1 %
Eosinophils Absolute: 0.3 10*3/uL (ref 0.0–0.5)
Eosinophils Relative: 6 %
HCT: 40.9 % (ref 39.0–52.0)
Hemoglobin: 13.1 g/dL (ref 13.0–17.0)
Immature Granulocytes: 0 %
Lymphocytes Relative: 46 %
Lymphs Abs: 2 10*3/uL (ref 0.7–4.0)
MCH: 27.5 pg (ref 26.0–34.0)
MCHC: 32 g/dL (ref 30.0–36.0)
MCV: 85.7 fL (ref 80.0–100.0)
Monocytes Absolute: 0.6 10*3/uL (ref 0.1–1.0)
Monocytes Relative: 13 %
Neutro Abs: 1.5 10*3/uL — ABNORMAL LOW (ref 1.7–7.7)
Neutrophils Relative %: 34 %
Platelets: 221 10*3/uL (ref 150–400)
RBC: 4.77 MIL/uL (ref 4.22–5.81)
RDW: 12.4 % (ref 11.5–15.5)
WBC: 4.3 10*3/uL (ref 4.0–10.5)
nRBC: 0 % (ref 0.0–0.2)

## 2021-12-08 LAB — URINALYSIS, ROUTINE W REFLEX MICROSCOPIC
Bilirubin Urine: NEGATIVE
Glucose, UA: NEGATIVE mg/dL
Hgb urine dipstick: NEGATIVE
Ketones, ur: NEGATIVE mg/dL
Leukocytes,Ua: NEGATIVE
Nitrite: NEGATIVE
Protein, ur: NEGATIVE mg/dL
Specific Gravity, Urine: 1.02 (ref 1.005–1.030)
pH: 7.5 (ref 5.0–8.0)

## 2021-12-08 LAB — LIPASE, BLOOD: Lipase: 32 U/L (ref 11–51)

## 2021-12-08 NOTE — ED Provider Notes (Signed)
Emergency Department Provider Note   I have reviewed the triage vital signs and the nursing notes.   HISTORY  Chief Complaint Flank Pain (Left )   HPI John Mckee is a 36 y.o. male presents to the ED with left flank and LUQ pain. Denies injury. Drives a truck for a living. No CP or SOB. No palpitations. No fever. No vomiting or diarrhea. No testicle pain, dysuria, or hematuria.    Past Medical History:  Diagnosis Date   Asthma     Review of Systems  Constitutional: No fever/chills Eyes: No visual changes. ENT: No sore throat. Cardiovascular: Denies chest pain. Respiratory: Denies shortness of breath. Gastrointestinal: Positive LUQ abdominal pain and flank pain.  No nausea, no vomiting.  No diarrhea.  No constipation. Genitourinary: Negative for dysuria. Musculoskeletal: Negative for back pain. Skin: Negative for rash. Neurological: Negative for headaches, focal weakness or numbness.  ____________________________________________   PHYSICAL EXAM:  VITAL SIGNS: ED Triage Vitals  Enc Vitals Group     BP 12/08/21 1828 119/70     Pulse Rate 12/08/21 1828 76     Resp 12/08/21 1828 16     Temp 12/08/21 1828 97.9 F (36.6 C)     Temp Source 12/08/21 1828 Oral     SpO2 12/08/21 1828 96 %     Weight 12/08/21 1823 155 lb (70.3 kg)     Height 12/08/21 1823 5\' 11"  (1.803 m)   Constitutional: Alert and oriented. Well appearing and in no acute distress. Eyes: Conjunctivae are normal. Head: Atraumatic. Nose: No congestion/rhinnorhea. Mouth/Throat: Mucous membranes are moist.  Neck: No stridor.  Cardiovascular: Normal rate, regular rhythm. Good peripheral circulation. Grossly normal heart sounds.   Respiratory: Normal respiratory effort.  No retractions. Lungs CTAB. Gastrointestinal: Soft and nontender. No distention.  Musculoskeletal: No lower extremity tenderness nor edema. No gross deformities of extremities. Neurologic:  Normal speech and language. No gross  focal neurologic deficits are appreciated.  Skin:  Skin is warm, dry and intact. No rash noted.  ____________________________________________   LABS (all labs ordered are listed, but only abnormal results are displayed)  Labs Reviewed  URINALYSIS, ROUTINE W REFLEX MICROSCOPIC - Abnormal; Notable for the following components:      Result Value   APPearance CLOUDY (*)    All other components within normal limits  CBC WITH DIFFERENTIAL/PLATELET - Abnormal; Notable for the following components:   Neutro Abs 1.5 (*)    All other components within normal limits  COMPREHENSIVE METABOLIC PANEL  LIPASE, BLOOD    ____________________________________________   PROCEDURES  Procedure(s) performed:   Procedures  None ____________________________________________   INITIAL IMPRESSION / ASSESSMENT AND PLAN / ED COURSE  Pertinent labs & imaging results that were available during my care of the patient were reviewed by me and considered in my medical decision making (see chart for details).   This patient is Presenting for Evaluation of flank pain, which does require a range of treatment options, and is a complaint that involves a high risk of morbidity and mortality.  The Differential Diagnoses includes but is not exclusive to acute cholecystitis, intrathoracic causes for epigastric abdominal pain, gastritis, duodenitis, pancreatitis, small bowel or large bowel obstruction, abdominal aortic aneurysm, hernia, gastritis, etc.  Clinical Laboratory Tests Ordered, included UA without infection or hematuria. Lipase negative. LFTs normal.   Radiologic Tests Ordered, included CT renal. I independently interpreted the images and agree with radiology interpretation.   Cardiac Monitor Tracing which shows NSR.   Social Determinants  of Health Risk patient is a non-smoker.   Medical Decision Making: Summary:  Patient presents to the ED with left flank pain. Overall workup is reassuring. Imaging  negative. Plan to treat with MSK medication and have patient follow up closely with PCP.   Reevaluation with update and discussion with patient he is feeling improved. Discussed results and ED return precautions.   Disposition: discharge  ____________________________________________  FINAL CLINICAL IMPRESSION(S) / ED DIAGNOSES  Final diagnoses:  Left flank pain    Note:  This document was prepared using Dragon voice recognition software and may include unintentional dictation errors.  Alona Bene, MD, [facility] Emergency Medicine    Bonni Neuser, Arlyss Repress, MD 12/14/21 [phone]

## 2021-12-08 NOTE — Discharge Instructions (Signed)

## 2021-12-08 NOTE — ED Triage Notes (Signed)
Left flank pain and lower quadrant pain . Denies urinary symptoms , no NVD.   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   No penile discharge .  No Hx kidney stone .

## 2022-04-23 ENCOUNTER — Ambulatory Visit: Payer: 59 | Admitting: Emergency Medicine

## 2022-07-26 ENCOUNTER — Encounter (HOSPITAL_BASED_OUTPATIENT_CLINIC_OR_DEPARTMENT_OTHER): Payer: Self-pay | Admitting: Emergency Medicine

## 2022-07-26 ENCOUNTER — Emergency Department (HOSPITAL_BASED_OUTPATIENT_CLINIC_OR_DEPARTMENT_OTHER)
Admission: EM | Admit: 2022-07-26 | Discharge: 2022-07-26 | Disposition: A | Discharge status: Home / Self Care | Payer: 59 | Attending: Emergency Medicine | Admitting: Emergency Medicine

## 2022-07-26 ENCOUNTER — Emergency Department (HOSPITAL_BASED_OUTPATIENT_CLINIC_OR_DEPARTMENT_OTHER): Payer: 59

## 2022-07-26 ENCOUNTER — Other Ambulatory Visit: Payer: Self-pay

## 2022-07-26 DIAGNOSIS — R0602 Shortness of breath: Secondary | ICD-10-CM | POA: Insufficient documentation

## 2022-07-26 DIAGNOSIS — J45909 Unspecified asthma, uncomplicated: Secondary | ICD-10-CM | POA: Diagnosis not present

## 2022-07-26 DIAGNOSIS — R202 Paresthesia of skin: Secondary | ICD-10-CM | POA: Insufficient documentation

## 2022-07-26 DIAGNOSIS — R0789 Other chest pain: Secondary | ICD-10-CM | POA: Insufficient documentation

## 2022-07-26 DIAGNOSIS — R079 Chest pain, unspecified: Secondary | ICD-10-CM | POA: Diagnosis not present

## 2022-07-26 LAB — BASIC METABOLIC PANEL
Anion gap: 8 (ref 5–15)
BUN: 10 mg/dL (ref 6–20)
CO2: 29 mmol/L (ref 22–32)
Calcium: 8.9 mg/dL (ref 8.9–10.3)
Chloride: 100 mmol/L (ref 98–111)
Creatinine, Ser: 1.15 mg/dL (ref 0.61–1.24)
GFR, Estimated: >60 mL/min (ref >60)
Glucose, Bld: 92 mg/dL (ref 70–99)
Potassium: 3.8 mmol/L (ref 3.5–5.1)
Sodium: 137 mmol/L (ref 135–145)

## 2022-07-26 LAB — CBC
HCT: 42.4 % (ref 39.0–52.0)
Hemoglobin: 13.8 g/dL (ref 13.0–17.0)
MCH: 27.9 pg (ref 26.0–34.0)
MCHC: 32.5 g/dL (ref 30.0–36.0)
MCV: 85.8 fL (ref 80.0–100.0)
Platelets: 225 10*3/uL (ref 150–400)
RBC: 4.94 MIL/uL (ref 4.22–5.81)
RDW: 12.7 % (ref 11.5–15.5)
WBC: 3.7 10*3/uL — ABNORMAL LOW (ref 4.0–10.5)
nRBC: 0 % (ref 0.0–0.2)

## 2022-07-26 LAB — TROPONIN I (HIGH SENSITIVITY): Troponin I (High Sensitivity): 4 ng/L (ref <18)

## 2022-07-26 MED ORDER — SODIUM CHLORIDE 0.9 % IV BOLUS
1000.0000 mL | Freq: Once | INTRAVENOUS | Status: AC
  Administered 2022-07-26: 1000 mL via INTRAVENOUS

## 2022-07-26 MED ORDER — KETOROLAC TROMETHAMINE 15 MG/ML IJ SOLN
15.0000 mg | Freq: Once | INTRAMUSCULAR | Status: AC
  Administered 2022-07-26: 15 mg via INTRAVENOUS
  Filled 2022-07-26: qty 1

## 2022-07-26 NOTE — ED Provider Notes (Signed)
[place] [facility] Provider Note  CSN: [id number] Arrival date & time: 07/26/22 0107  Chief Complaint(s) Chest Pain and Shortness of Breath  HPI NORVIN OHLIN is a 37 y.o. male with past medical history as below, significant for asthma who presents to the ED with complaint of chest wall pain/aching sensation.  Symptom onset over the past 2 days, intermittent chest aching, tightness sensation, left-sided chest wall.  Nonradiating.  Denies similar symptoms in the past.  Unable to identify alleviating chest pain factors, no current dyspnea.  No nausea, vomiting, palpitations or diaphoresis.  No syncope or near syncope.  No significant cardiac history personally.  No recent illicit drug use or excessive stimulant use.  No fevers, chills, nausea or vomiting.  No recent chest wall trauma.  Pain described as intermittent, lasting for few seconds up to a minute and resolving spontaneously.  Past Medical History Past Medical History:  Diagnosis Date   Asthma    There are no problems to display for this patient.  Home Medication(s) Prior to Admission medications   Medication Sig Start Date End Date Taking? Authorizing Provider  albuterol (VENTOLIN HFA) 108 (90 Base) MCG/ACT inhaler Inhale 1-2 puffs into the lungs every 6 (six) hours as needed for wheezing or shortness of breath. 10/29/20   Long, Arlyss Repress, MD  benzonatate (TESSALON) 100 MG capsule Take 1 capsule (100 mg total) by mouth 3 (three) times daily as needed for cough. 10/29/20   Long, Arlyss Repress, MD  fluticasone (FLONASE) 50 MCG/ACT nasal spray Place 2 sprays into both nostrils daily for 7 days. 10/29/20 11/05/20  Long, Arlyss Repress, MD                                                                                                                                    Past Surgical History Past Surgical History:  Procedure Laterality Date   APPENDECTOMY     Family History Family History  Problem Relation  Age of Onset   Healthy Mother    Healthy Father     Social History Social History   Tobacco Use   Smoking status: Never   Smokeless tobacco: Never  Vaping Use   Vaping status: Never Used  Substance Use Topics   Alcohol use: Yes   Drug use: Yes    Types: Marijuana   Allergies Patient has no known allergies.  Review of Systems Review of Systems  Constitutional:  Negative for chills and fever.  Respiratory:  Negative for cough and shortness of breath.   Cardiovascular:  Positive for chest pain. Negative for palpitations.  Gastrointestinal:  Negative for abdominal pain, nausea and vomiting.  Genitourinary:  Negative for difficulty urinating.  Musculoskeletal:  Negative for gait problem.  All other systems reviewed and are negative.   Physical Exam Vital Signs  I have reviewed the triage vital signs BP (!) 141/72 (BP Location: Left Arm)  Pulse 77   Temp 99.1 F (37.3 C) (Oral)   Resp 16   Ht 5\' 10"  (1.778 m)   Wt 70.3 kg   SpO2 99%   BMI 22.24 kg/m  Physical Exam Vitals and nursing note reviewed.  Constitutional:      General: He is not in acute distress.    Appearance: Normal appearance. He is well-developed. He is not ill-appearing.  HENT:     Head: Normocephalic and atraumatic.     Right Ear: External ear normal.     Left Ear: External ear normal.     Mouth/Throat:     Mouth: Mucous membranes are moist.  Eyes:     General: No scleral icterus. Cardiovascular:     Rate and Rhythm: Normal rate and regular rhythm.     Pulses: Normal pulses.     Heart sounds: Normal heart sounds. No murmur heard.    No gallop. No S3 or S4 sounds.  Pulmonary:     Effort: Pulmonary effort is normal. No respiratory distress.     Breath sounds: Normal breath sounds.  Abdominal:     General: Abdomen is flat.     Palpations: Abdomen is soft.     Tenderness: There is no abdominal tenderness.  Musculoskeletal:     Cervical back: No rigidity.     Right lower leg: No edema.      Left lower leg: No edema.  Skin:    General: Skin is warm and dry.     Capillary Refill: Capillary refill takes less than 2 seconds.  Neurological:     Mental Status: He is alert.  Psychiatric:        Mood and Affect: Mood normal.        Behavior: Behavior normal.     ED Results and Treatments Labs (all labs ordered are listed, but only abnormal results are displayed) Labs Reviewed  CBC - Abnormal; Notable for the following components:      Result Value   WBC 3.7 (*)    All other components within normal limits  BASIC METABOLIC PANEL  TROPONIN I (HIGH SENSITIVITY)                                                                                                                          Radiology DG Chest 2 View  Result Date: 07/26/2022 CLINICAL DATA:  Left-sided chest pain for 2 days. EXAM: CHEST - 2 VIEW COMPARISON:  08/28/2007. FINDINGS: The heart size and mediastinal contours are within normal limits. Both lungs are clear. No acute osseous abnormality. IMPRESSION: No active cardiopulmonary disease. Electronically Signed   By: Thornell Sartorius M.D.   On: 07/26/2022 01:42    Pertinent labs & imaging results that were available during my care of the patient were reviewed by me and considered in my medical decision making (see MDM for details).  Medications Ordered in ED Medications  ketorolac (TORADOL) 15 MG/ML injection 15 mg (15 mg Intravenous Given 07/26/22  0158)  sodium chloride 0.9 % bolus 1,000 mL (0 mLs Intravenous Stopped 07/26/22 0232)                                                                                                                                     Procedures Procedures  (including critical care time)  Medical Decision Making / ED Course    Medical Decision Making:    EVERT WENRICH is a 37 y.o. male with past medical history as below, significant for asthma who presents to the ED with complaint of chest wall pain/aching sensation. . The complaint  involves an extensive differential diagnosis and also carries with it a high risk of complications and morbidity.  Serious etiology was considered. Ddx includes but is not limited to: Differential includes all life-threatening causes for chest pain. This includes but is not exclusive to acute coronary syndrome, aortic dissection, pulmonary embolism, cardiac tamponade, community-acquired pneumonia, pericarditis, musculoskeletal chest wall pain, etc.   Complete initial physical exam performed, notably the patient  was no acute distress, sitting upright on stretcher, not currently symptomatic.    Reviewed and confirmed nursing documentation for past medical history, family history, social history.  Vital signs reviewed.         The patient's chest pain is not suggestive of pulmonary embolus, cardiac ischemia, aortic dissection, pericarditis, myocarditis, pulmonary embolism, pneumothorax, pneumonia, Zoster, or esophageal perforation, or other serious etiology.  Historically not abrupt in onset, tearing or ripping, pulses symmetric. EKG nonspecific for ischemia/infarction. No dysrhythmias, brugada, WPW, prolonged QT noted.   Troponin negative x1 (symptoms >4 hours). CXR reviewed. Labs without demonstration of acute pathology unless otherwise noted above. Low HEART Score: 0-3 points (0.9-1.7% risk of MACE).]   Given the extremely low risk of these diagnoses further testing and evaluation for these possibilities does not appear to be indicated at this time. Patient in no distress and overall condition improved here in the ED. Detailed discussions were had with the patient regarding current findings, and need for close f/u with PCP or on call doctor. The patient has been instructed to return immediately if the symptoms worsen in any way for re-evaluation. Patient verbalized understanding and is in agreement with current care plan. All questions answered prior to discharge.             Additional  history obtained: -Additional history obtained from spouse -External records from outside source obtained and reviewed including: Chart review including previous notes, labs, imaging, consultation notes including prior ED visits, prior labs and imaging, medications   Lab Tests: -I ordered, reviewed, and interpreted labs.   The pertinent results include:   Labs Reviewed  CBC - Abnormal; Notable for the following components:      Result Value   WBC 3.7 (*)    All other components within normal limits  BASIC METABOLIC PANEL  TROPONIN I (HIGH SENSITIVITY)    Notable for labs stable  EKG   EKG Interpretation Date/Time:  Thursday July 26 2022 01:17:37 EDT Ventricular Rate:  72 PR Interval:  153 QRS Duration:  90 QT Interval:  350 QTC Calculation: 383 R Axis:   71  Text Interpretation: Sinus arrhythmia Borderline T wave abnormalities twi noted no prior Confirmed by Tanda Rockers ([id number]) on 07/26/2022 1:36:32 AM         Imaging Studies ordered: I ordered imaging studies including cxr I independently visualized the following imaging with scope of interpretation limited to determining acute life threatening conditions related to emergency care; findings noted above, significant for stable imaging I independently visualized and interpreted imaging. I agree with the radiologist interpretation   Medicines ordered and prescription drug management: Meds ordered this encounter  Medications   ketorolac (TORADOL) 15 MG/ML injection 15 mg   sodium chloride 0.9 % bolus 1,000 mL    -I have reviewed the patients home medicines and have made adjustments as needed   Consultations Obtained: na   Cardiac Monitoring: The patient was maintained on a cardiac monitor.  I personally viewed and interpreted the cardiac monitored which showed an underlying rhythm of: NSR  Social Determinants of Health:  Diagnosis or treatment significantly limited by social determinants of health: non smoker, no  pcp   Reevaluation: After the interventions noted above, I reevaluated the patient and found that they have improved  Co morbidities that complicate the patient evaluation  Past Medical History:  Diagnosis Date   Asthma       Dispostion: Disposition decision including need for hospitalization was considered, and patient discharged from emergency department.    Final Clinical Impression(s) / ED Diagnoses Final diagnoses:  Chest pain with low risk of acute coronary syndrome     This chart was dictated using voice recognition software.  Despite best efforts to proofread,  errors can occur which can change the documentation meaning.    Sloan Leiter, DO 07/26/22 0234

## 2022-07-26 NOTE — ED Triage Notes (Signed)
  Patient comes in with L sided chest pain that has been going on for a couple days.  Patient states the pain is intermittent, sharp, and lasted about 20-30 seconds at a time.  Denies any N/V or diaphoresis with chest pain.  No radiating to shoulder, jaw, neck, or back.  Patient states he just started taking some OTC supplements.  No pain at this time.   Denies any street drugs.  Denies smoking.

## 2022-07-26 NOTE — Discharge Instructions (Addendum)
Return to the Emergency Department if you have unusual chest pain, pressure, or discomfort, shortness of breath, nausea, vomiting, burping, heartburn, tingling upper body parts, sweating, cold, clammy skin, or racing heartbeat. Call 911 if you think you are having a heart attack. Follow cardiac diet - avoid fatty & fried foods, don't eat too much red meat, eat lots of fruits & vegetables, and dairy products should be low fat. Please lose weight if you are overweight. Become more active with walking, gardening, or any other activity that gets you to moving.     It was a pleasure caring for you today in the emergency department.  Please return to the emergency department for any worsening or worrisome symptoms.

## 2022-11-23 ENCOUNTER — Encounter (HOSPITAL_BASED_OUTPATIENT_CLINIC_OR_DEPARTMENT_OTHER): Payer: Self-pay

## 2022-11-23 ENCOUNTER — Emergency Department (HOSPITAL_BASED_OUTPATIENT_CLINIC_OR_DEPARTMENT_OTHER)
Admission: EM | Admit: 2022-11-23 | Discharge: 2022-11-23 | Disposition: A | Discharge status: Home / Self Care | Payer: 59 | Attending: Emergency Medicine | Admitting: Emergency Medicine

## 2022-11-23 ENCOUNTER — Other Ambulatory Visit: Payer: Self-pay

## 2022-11-23 ENCOUNTER — Other Ambulatory Visit (HOSPITAL_BASED_OUTPATIENT_CLINIC_OR_DEPARTMENT_OTHER): Payer: Self-pay

## 2022-11-23 DIAGNOSIS — Z760 Encounter for issue of repeat prescription: Secondary | ICD-10-CM | POA: Diagnosis not present

## 2022-11-23 DIAGNOSIS — J45909 Unspecified asthma, uncomplicated: Secondary | ICD-10-CM | POA: Insufficient documentation

## 2022-11-23 DIAGNOSIS — Z20822 Contact with and (suspected) exposure to covid-19: Secondary | ICD-10-CM | POA: Insufficient documentation

## 2022-11-23 DIAGNOSIS — R0981 Nasal congestion: Secondary | ICD-10-CM | POA: Diagnosis not present

## 2022-11-23 DIAGNOSIS — Z8709 Personal history of other diseases of the respiratory system: Secondary | ICD-10-CM

## 2022-11-23 LAB — RESP PANEL BY RT-PCR (RSV, FLU A&B, COVID)  RVPGX2
Influenza A by PCR: NEGATIVE
Influenza B by PCR: NEGATIVE
Resp Syncytial Virus by PCR: NEGATIVE
SARS Coronavirus 2 by RT PCR: NEGATIVE

## 2022-11-23 MED ORDER — BENZONATATE 100 MG PO CAPS
100.0000 mg | ORAL_CAPSULE | Freq: Three times a day (TID) | ORAL | 0 refills | Status: AC
  Filled 2022-11-23: qty 21, 7d supply, fill #0

## 2022-11-23 MED ORDER — ALBUTEROL SULFATE HFA 108 (90 BASE) MCG/ACT IN AERS
1.0000 | INHALATION_SPRAY | Freq: Once | RESPIRATORY_TRACT | Status: AC
  Administered 2022-11-23: 1 via RESPIRATORY_TRACT
  Filled 2022-11-23: qty 6.7

## 2022-11-23 MED ORDER — FLUTICASONE PROPIONATE 50 MCG/ACT NA SUSP
2.0000 | Freq: Every day | NASAL | 0 refills | Status: AC
  Filled 2022-11-23: qty 16, 30d supply, fill #0

## 2022-11-23 MED ORDER — CETIRIZINE HCL 10 MG PO TABS
10.0000 mg | ORAL_TABLET | Freq: Every day | ORAL | 0 refills | Status: AC
  Filled 2022-11-23: qty 30, 30d supply, fill #0

## 2022-11-23 NOTE — ED Provider Notes (Signed)
[place] [facility] Provider Note   CSN: [id number] Arrival date & time: 11/23/22  1326    History  Chief Complaint  Patient presents with   Congestion    John Mckee is a 37 y.o. male with no significant past medical history here for evaluation of congestion and rhinorrhea.  States he is exposed to sick individual driving in a car yesterday.  No fever, headache, neck pain, chest pain, shortness of breath, nausea, vomiting.  He feels like he is getting "sick."  No meds PTA.  He does state he has a history of allergies and asthma remotely and has been out of his home allergy medication and albuterol.  HPI     Home Medications Prior to Admission medications   Medication Sig Start Date End Date Taking? Authorizing Provider  benzonatate (TESSALON) 100 MG capsule Take 1 capsule (100 mg total) by mouth every 8 (eight) hours. 11/23/22  Yes Jamill Wetmore A, PA-C  cetirizine (ZYRTEC ALLERGY) 10 MG tablet Take 1 tablet (10 mg total) by mouth daily. 11/23/22  Yes Cadince Hilscher A, PA-C  fluticasone (FLONASE) 50 MCG/ACT nasal spray Place 2 sprays into both nostrils daily. 11/23/22  Yes Antoino Westhoff A, PA-C  albuterol (VENTOLIN HFA) 108 (90 Base) MCG/ACT inhaler Inhale 1-2 puffs into the lungs every 6 (six) hours as needed for wheezing or shortness of breath. 10/29/20   Long, Arlyss Repress, MD      Allergies    Patient has no known allergies.    Review of Systems   Review of Systems  Constitutional: Negative.   HENT:  Positive for congestion, postnasal drip, rhinorrhea and sneezing. Negative for dental problem, drooling, ear discharge, ear pain, facial swelling, hearing loss, mouth sores, nosebleeds, sinus pressure, sinus pain, sore throat, tinnitus, trouble swallowing and voice change.   Respiratory: Negative.    Cardiovascular: Negative.   Gastrointestinal: Negative.   Genitourinary: Negative.   Musculoskeletal: Negative.   Skin: Negative.    Neurological: Negative.   All other systems reviewed and are negative.   Physical Exam Updated Vital Signs BP (!) 140/96 (BP Location: Left Arm)   Pulse 93   Temp 98.8 F (37.1 C) (Oral)   Resp 17   Ht 6' (1.829 m)   Wt 68 kg   SpO2 100%   BMI 20.34 kg/m  Physical Exam Vitals and nursing note reviewed.  Constitutional:      General: He is not in acute distress.    Appearance: He is well-developed. He is not ill-appearing, toxic-appearing or diaphoretic.  HENT:     Head: Normocephalic and atraumatic.     Nose: Nose normal.     Mouth/Throat:     Mouth: Mucous membranes are moist.  Eyes:     Pupils: Pupils are equal, round, and reactive to light.  Cardiovascular:     Rate and Rhythm: Normal rate and regular rhythm.     Pulses: Normal pulses.     Heart sounds: Normal heart sounds.  Pulmonary:     Effort: Pulmonary effort is normal. No respiratory distress.     Breath sounds: Normal breath sounds.  Abdominal:     General: Bowel sounds are normal. There is no distension.     Palpations: Abdomen is soft.     Tenderness: There is no abdominal tenderness. There is no guarding or rebound.  Musculoskeletal:        General: No swelling, tenderness, deformity or signs of injury. Normal range of motion.  Cervical back: Normal range of motion and neck supple.     Right lower leg: No edema.     Left lower leg: No edema.  Skin:    General: Skin is warm and dry.     Capillary Refill: Capillary refill takes less than 2 seconds.  Neurological:     General: No focal deficit present.     Mental Status: He is alert and oriented to person, place, and time.    ED Results / Procedures / Treatments   Labs (all labs ordered are listed, but only abnormal results are displayed) Labs Reviewed  RESP PANEL BY RT-PCR (RSV, FLU A&B, COVID)  [id number]    EKG None  Radiology No results found.  Procedures Procedures    Medications Ordered in ED Medications  albuterol (VENTOLIN HFA)  108 (90 Base) MCG/ACT inhaler 1 puff (1 puff Inhalation Given 11/23/22 1527)    ED Course/ Medical Decision Making/ A&P   37 year old here for evaluation of congestion and rhinorrhea which started earlier today.  He was exposed to a sick contacts yesterday he is unsure what they had.  He is afebrile, nonseptic, not ill-appearing.  His heart and lungs are clear.  Abdomen soft, tender.  Ears no evidence of otitis.  He has some clear rhinorrhea however no maxillary or facial tenderness.  His posterior pharynx is clear.  He has no neck stiffness or neck rigidity of the suspicion for meningitis.  Labs personally viewed and interpreted COVID, flu, RSV negative  Discussed results with patient.  Will treat symptomatically.  Do not feel he needs antibiotics at this time.  I have low suspicion for acute bacterial illness causing his symptoms.  Will also refill his albuterol inhaler and allergy medication.  He currently has no wheeze on exam.  Denies any chest pain or shortness of breath.  No clinical evidence of VTE on exam he is PERC negative.  The patient has been appropriately medically screened and/or stabilized in the ED. I have low suspicion for any other emergent medical condition which would require further screening, evaluation or treatment in the ED or require inpatient management.  Patient is hemodynamically stable and in no acute distress.  Patient able to ambulate in department prior to ED.  Evaluation does not show acute pathology that would require ongoing or additional emergent interventions while in the emergency department or further inpatient treatment.  I have discussed the diagnosis with the patient and answered all questions.  Pain is been managed while in the emergency department and patient has no further complaints prior to discharge.  Patient is comfortable with plan discussed in room and is stable for discharge at this time.  I have discussed strict return precautions for returning to  the emergency department.  Patient was encouraged to follow-up with PCP/specialist refer to at discharge.                                 Medical Decision Making Amount and/or Complexity of Data Reviewed External Data Reviewed: labs, radiology and notes. Labs: ordered. Decision-making details documented in ED Course.  Risk OTC drugs. Prescription drug management. Parenteral controlled substances. Diagnosis or treatment significantly limited by social determinants of health.          Final Clinical Impression(s) / ED Diagnoses Final diagnoses:  Congestion of nasal sinus  History of asthma  Medication refill    Rx / DC Orders ED Discharge Orders  Ordered    cetirizine (ZYRTEC ALLERGY) 10 MG tablet  Daily        11/23/22 1519    fluticasone (FLONASE) 50 MCG/ACT nasal spray  Daily        11/23/22 1519    benzonatate (TESSALON) 100 MG capsule  Every 8 hours        11/23/22 1519              Izzy Courville A, PA-C 11/23/22 1841    Franne Forts, DO 11/25/22 [phone]

## 2022-11-23 NOTE — ED Triage Notes (Signed)
Patient here POV from Home.  Endorses Congestion (Facial) since today. Recent exposure to sick individual.   No known Fevers. No Cough. Some Fatigue. No Body Aches.   NAD noted during Triage. A&Ox4. GCS 15. Ambulatory.

## 2022-11-23 NOTE — Discharge Instructions (Signed)
It was a pleasure taking care of you in the ED today  Your COVID, flu test was neg  I have written you for a few medications to help with your symptoms  Return for new or worsening symptoms

## 2022-11-26 ENCOUNTER — Emergency Department (HOSPITAL_BASED_OUTPATIENT_CLINIC_OR_DEPARTMENT_OTHER): Payer: Self-pay

## 2022-11-26 ENCOUNTER — Encounter (HOSPITAL_BASED_OUTPATIENT_CLINIC_OR_DEPARTMENT_OTHER): Payer: Self-pay | Admitting: Urology

## 2022-11-26 ENCOUNTER — Emergency Department (HOSPITAL_BASED_OUTPATIENT_CLINIC_OR_DEPARTMENT_OTHER)
Admission: EM | Admit: 2022-11-26 | Discharge: 2022-11-26 | Disposition: A | Discharge status: Home / Self Care | Payer: Self-pay | Attending: Emergency Medicine | Admitting: Emergency Medicine

## 2022-11-26 DIAGNOSIS — B349 Viral infection, unspecified: Secondary | ICD-10-CM

## 2022-11-26 DIAGNOSIS — Z20822 Contact with and (suspected) exposure to covid-19: Secondary | ICD-10-CM | POA: Insufficient documentation

## 2022-11-26 LAB — RESP PANEL BY RT-PCR (RSV, FLU A&B, COVID)  RVPGX2
Influenza A by PCR: NEGATIVE
Influenza B by PCR: NEGATIVE
Resp Syncytial Virus by PCR: NEGATIVE
SARS Coronavirus 2 by RT PCR: NEGATIVE

## 2022-11-26 MED ORDER — ACETAMINOPHEN 325 MG PO TABS
650.0000 mg | ORAL_TABLET | Freq: Once | ORAL | Status: AC
  Administered 2022-11-26: 650 mg via ORAL
  Filled 2022-11-26: qty 2

## 2022-11-26 NOTE — ED Notes (Signed)
Pt was seen here on Friday for congestion, all swabs were neg.  Pt continues to feel the same despite using inhaler, allergy meds and nasal spray.

## 2022-11-26 NOTE — Discharge Instructions (Signed)
Make sure to follow-up outpatient  Return for new or worsening symptoms

## 2022-11-26 NOTE — ED Triage Notes (Signed)
Pt states seen here on Friday for congestion  COVID and flu was negative Friday  States body aches and chest congestion    Using Inhaler, nasal spray, and allergy medication with little relief

## 2022-11-26 NOTE — ED Provider Notes (Signed)
Care assumed from previous provider.  See note for full HPI.  In summation, 37 year old who has had URI symptoms since Friday, 3 days PTA.  I actually saw patient on Friday he had negative viral panel.  He was started on Flonase, Zyrtec for his congestion and rhinorrhea.  He returns today due to persistent symptoms.  Patient returns today due to persistent symptoms.  Plan on follow-up on his chest x-ray if negative he can DC home Physical Exam  BP 110/66 (BP Location: Left Arm)   Pulse (!) 109   Temp 99.7 F (37.6 C)   Resp 18   Ht 6' (1.829 m)   Wt 70.3 kg   SpO2 98%   BMI 21.02 kg/m   Physical Exam Vitals and nursing note reviewed.  Constitutional:      General: He is not in acute distress.    Appearance: He is well-developed. He is not ill-appearing or diaphoretic.  HENT:     Head: Atraumatic.  Eyes:     Pupils: Pupils are equal, round, and reactive to light.  Cardiovascular:     Rate and Rhythm: Normal rate and regular rhythm.  Pulmonary:     Effort: Pulmonary effort is normal. No respiratory distress.  Abdominal:     General: There is no distension.     Palpations: Abdomen is soft.  Musculoskeletal:        General: Normal range of motion.     Cervical back: Normal range of motion and neck supple.  Skin:    General: Skin is warm and dry.  Neurological:     General: No focal deficit present.     Mental Status: He is alert and oriented to person, place, and time.     Procedures  Procedures Labs Reviewed  RESP PANEL BY RT-PCR (RSV, FLU A&B, COVID)  RVPGX2   DG Chest 2 View  Result Date: 11/26/2022 CLINICAL DATA:  Cough, chest pain EXAM: CHEST - 2 VIEW COMPARISON:  07/26/2022 FINDINGS: The heart size and mediastinal contours are within normal limits. Both lungs are clear. The visualized skeletal structures are unremarkable. IMPRESSION: Normal study. Electronically Signed   By: Charlett Nose M.D.   On: 11/26/2022 22:16    ED Course / MDM  Care assumed from  previous provider.  See note for full HPI.  In summation, 37 year old who has had URI symptoms since Friday, 3 days PTA.  I actually saw patient on Friday he had negative viral panel.  He was started on Flonase, Zyrtec for his congestion and rhinorrhea.  He returns today due to persistent symptoms.  Patient returns today due to persistent symptoms.  Plan on follow-up on his chest x-ray if negative he can DC home   Labs and imaging personally viewed interpreted Viral panel negative Chest x-ray without acute abnormality  Discussed results with patient.  Will DC home with symptomatic management.  No chest pain, shortness of breath, no clinical evidence of VTE on exam he appears clinically well-hydrated. Clinical Course as of 11/26/22 2302  Mon Nov 26, 2022  2154 FU on chest xray, Viral panel neg [BH]    Clinical Course User Index [BH] Arieona Swaggerty A, PA-C   Medical Decision Making Amount and/or Complexity of Data Reviewed External Data Reviewed: labs, radiology and notes. Labs: ordered. Decision-making details documented in ED Course. Radiology: ordered and independent interpretation performed. Decision-making details documented in ED Course.  Risk OTC drugs. Decision regarding hospitalization. Diagnosis or treatment significantly limited by social determinants of health.  Nikeisha Klutz A, PA-C 11/26/22 2302    Sloan Leiter, DO 11/28/22 [phone]

## 2022-11-26 NOTE — ED Provider Notes (Signed)
[place] [facility] Provider Note   CSN: [id number] Arrival date & time: 11/26/22  1911     History  Chief Complaint  Patient presents with   Generalized Body Aches    John Mckee is a 37 y.o. male who returns to the ED after being seen initially on Friday for cough, congestion, body aches and chills and sore throat.  He reports that he has been around a sick and dividual, a coworker but he is unsure what the coworker has.  He denies any fevers at home.  Denies trouble swallowing, nausea, vomiting, chest pain or shortness of breath.  Reports he was given albuterol inhaler and Tessalon Perles on Friday and they are not relieving his symptoms.  HPI     Home Medications Prior to Admission medications   Not on File      Allergies    Patient has no known allergies.    Review of Systems   Review of Systems  Constitutional:  Positive for chills.  HENT:  Positive for congestion and sore throat.   All other systems reviewed and are negative.   Physical Exam Updated Vital Signs BP 110/66 (BP Location: Left Arm)   Pulse (!) 109   Temp 99.7 F (37.6 C)   Resp 18   Ht 6' (1.829 m)   Wt 70.3 kg   SpO2 98%   BMI 21.02 kg/m  Physical Exam Vitals and nursing note reviewed.  Constitutional:      General: He is not in acute distress.    Appearance: Normal appearance. He is not ill-appearing, toxic-appearing or diaphoretic.  HENT:     Head: Normocephalic and atraumatic.     Nose: Nose normal.     Mouth/Throat:     Mouth: Mucous membranes are moist.     Pharynx: Oropharynx is clear. Posterior oropharyngeal erythema present. No oropharyngeal exudate.  Eyes:     Extraocular Movements: Extraocular movements intact.     Conjunctiva/sclera: Conjunctivae normal.     Pupils: Pupils are equal, round, and reactive to light.  Cardiovascular:     Rate and Rhythm: Normal rate and regular rhythm.  Pulmonary:     Effort: Pulmonary effort is  normal. No respiratory distress.     Breath sounds: Normal breath sounds. No wheezing.  Abdominal:     General: Abdomen is flat. Bowel sounds are normal.     Palpations: Abdomen is soft.     Tenderness: There is no abdominal tenderness.  Musculoskeletal:     Cervical back: Normal range of motion and neck supple. No tenderness.  Skin:    General: Skin is warm and dry.     Capillary Refill: Capillary refill takes less than 2 seconds.  Neurological:     Mental Status: He is alert and oriented to person, place, and time.     ED Results / Procedures / Treatments   Labs (all labs ordered are listed, but only abnormal results are displayed) Labs Reviewed  RESP PANEL BY RT-PCR (RSV, FLU A&B, COVID)  RVPGX2    EKG None  Radiology No results found.  Procedures Procedures   Medications Ordered in ED Medications  acetaminophen (TYLENOL) tablet 650 mg (650 mg Oral Given 11/26/22 2130)    ED Course/ Medical Decision Making/ A&P Clinical Course as of 11/26/22 2156  Mon Nov 26, 2022  2154 FU on chest xray, Viral panel neg [BH]    Clinical Course User Index [BH] Henderly, Britni A, PA-C  Medical Decision Making  38 year old male presents to ED for evaluation.  Please see HPI for further details.  On exam patient is afebrile and nontachycardic.  Lung sounds are clear bilaterally and he is not hypoxic.  His abdomen is soft and compressible.  Posterior pharynx has no erythema, uvula is midline.  Handling secretions appropriately.  No change in phonation.  Nontoxic in appearance.  Viral testing pending.  Imaging pending.  Will sign patient out to [facility].   Final Clinical Impression(s) / ED Diagnoses Final diagnoses:  Viral illness    Rx / DC Orders ED Discharge Orders     None         Al Decant, PA-C 11/26/22 2156    Sloan Leiter, DO 11/28/22 [phone]

## 2022-11-27 ENCOUNTER — Encounter (HOSPITAL_BASED_OUTPATIENT_CLINIC_OR_DEPARTMENT_OTHER): Payer: Self-pay

## 2023-01-23 ENCOUNTER — Emergency Department (HOSPITAL_BASED_OUTPATIENT_CLINIC_OR_DEPARTMENT_OTHER)
Admission: EM | Admit: 2023-01-23 | Discharge: 2023-01-23 | Disposition: A | Discharge status: Home / Self Care | Payer: 59 | Attending: Emergency Medicine | Admitting: Emergency Medicine

## 2023-01-23 ENCOUNTER — Other Ambulatory Visit: Payer: Self-pay

## 2023-01-23 ENCOUNTER — Encounter (HOSPITAL_BASED_OUTPATIENT_CLINIC_OR_DEPARTMENT_OTHER): Payer: Self-pay

## 2023-01-23 ENCOUNTER — Emergency Department (HOSPITAL_BASED_OUTPATIENT_CLINIC_OR_DEPARTMENT_OTHER): Payer: 59

## 2023-01-23 DIAGNOSIS — J209 Acute bronchitis, unspecified: Secondary | ICD-10-CM | POA: Insufficient documentation

## 2023-01-23 DIAGNOSIS — J45909 Unspecified asthma, uncomplicated: Secondary | ICD-10-CM | POA: Insufficient documentation

## 2023-01-23 DIAGNOSIS — Z7951 Long term (current) use of inhaled steroids: Secondary | ICD-10-CM | POA: Insufficient documentation

## 2023-01-23 DIAGNOSIS — R059 Cough, unspecified: Secondary | ICD-10-CM | POA: Diagnosis not present

## 2023-01-23 DIAGNOSIS — Z20822 Contact with and (suspected) exposure to covid-19: Secondary | ICD-10-CM | POA: Insufficient documentation

## 2023-01-23 DIAGNOSIS — J029 Acute pharyngitis, unspecified: Secondary | ICD-10-CM | POA: Diagnosis not present

## 2023-01-23 LAB — RESP PANEL BY RT-PCR (RSV, FLU A&B, COVID)  RVPGX2
Influenza A by PCR: NEGATIVE
Influenza B by PCR: NEGATIVE
Resp Syncytial Virus by PCR: NEGATIVE
SARS Coronavirus 2 by RT PCR: NEGATIVE

## 2023-01-23 LAB — GROUP A STREP BY PCR: Group A Strep by PCR: NOT DETECTED

## 2023-01-23 NOTE — ED Provider Notes (Signed)
 [place] [facility]  Provider Note  CSN: [id number] Arrival date & time: 01/23/23 0226  History Chief Complaint  Patient presents with   Sore Throat   Cough    John Mckee is a 38 y.o. male with history of asthma reports several days of URI symptoms, cough, sore throat. No fevers. Has been using his inhaler with improvement. Concerned for PNA.    Home Medications Prior to Admission medications   Medication Sig Start Date End Date Taking? Authorizing Provider  albuterol  (VENTOLIN  HFA) 108 (90 Base) MCG/ACT inhaler Inhale 1-2 puffs into the lungs every 6 (six) hours as needed for wheezing or shortness of breath. 10/29/20   Long, Joshua G, MD  benzonatate  (TESSALON ) 100 MG capsule Take 1 capsule (100 mg total) by mouth every 8 (eight) hours. 11/23/22   Henderly, Britni A, PA-C  cetirizine  (ZYRTEC  ALLERGY) 10 MG tablet Take 1 tablet (10 mg total) by mouth daily. 11/23/22   Henderly, Britni A, PA-C  fluticasone  (FLONASE ) 50 MCG/ACT nasal spray Place 2 sprays into both nostrils daily. 11/23/22   Henderly, Britni A, PA-C     Allergies    Patient has no known allergies.   Review of Systems   Review of Systems Please see HPI for pertinent positives and negatives  Physical Exam BP (!) 144/86   Pulse 79   Temp 98.1 F (36.7 C) (Oral)   Resp 18   SpO2 100%   Physical Exam Vitals and nursing note reviewed.  Constitutional:      Appearance: Normal appearance.  HENT:     Head: Normocephalic and atraumatic.     Nose: Nose normal.     Mouth/Throat:     Mouth: Mucous membranes are moist.  Eyes:     Extraocular Movements: Extraocular movements intact.     Conjunctiva/sclera: Conjunctivae normal.  Cardiovascular:     Rate and Rhythm: Normal rate.  Pulmonary:     Effort: Pulmonary effort is normal.     Breath sounds: Normal breath sounds. No wheezing or rales.  Abdominal:     General: Abdomen is flat.     Palpations: Abdomen is soft.      Tenderness: There is no abdominal tenderness.  Musculoskeletal:        General: No swelling. Normal range of motion.     Cervical back: Neck supple.  Skin:    General: Skin is warm and dry.  Neurological:     General: No focal deficit present.     Mental Status: He is alert.  Psychiatric:        Mood and Affect: Mood normal.     ED Results / Procedures / Treatments   EKG None  Procedures Procedures  Medications Ordered in the ED Medications - No data to display  Initial Impression and Plan  Patient well appearing here with recent viral URI symptoms and a specific concern for PNA. He has reassuring exam and vitals. Covid/Flu/RSV and strep swab negative. I personally viewed the images from radiology studies and agree with radiologist interpretation: CXR is clear. Patient already has albuterol  inhaler at home, provided spacer for use when he feels wheezy. OTC symptomatic medications otherwise.    ED Course       MDM Rules/Calculators/A&P Medical Decision Making Problems Addressed: Acute bronchitis, unspecified organism: acute illness or injury  Amount and/or Complexity of Data Reviewed Labs: ordered. Decision-making details documented in ED Course. Radiology: ordered and independent interpretation performed. Decision-making details documented in  ED Course.  Risk OTC drugs. Prescription drug management.     Final Clinical Impression(s) / ED Diagnoses Final diagnoses:  Acute bronchitis, unspecified organism    Rx / DC Orders ED Discharge Orders     None        Charmayne Cooper, MD 01/23/23 [phone]

## 2023-01-23 NOTE — ED Triage Notes (Signed)
 Pt feels like he has a cold with sore throat x2-3 days. Pt reports chest nagging feeling every time he coughs. Reports last time he had similar sx received tessalon  pearls and they did not work. Pt hx of asthma reports he used inhaler a few times PTA for trouble breathing. Dry cough. 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

## 2023-08-09 ENCOUNTER — Ambulatory Visit: Payer: Self-pay

## 2023-08-09 NOTE — Telephone Encounter (Signed)
 FYI Only or Action Required?: FYI only for provider.  Patient to be new patient at [facility], scheduled new patient appt for 9/23, advised UC in meantime.  Called Nurse Triage reporting Back Pain and Testicle Pain.  Symptoms began yesterday.  Interventions attempted: Nothing.  Symptoms are: unchanged.  Triage Disposition: See Physician Within 24 Hours  Patient/caregiver understands and will follow disposition?: Unsure     Copied from CRM [id number]. Topic: Clinical - Red Word Triage >> Aug 09, 2023  2:40 PM Elle L wrote: Red Word that prompted transfer to Nurse Triage: The patient called to establish care at [facility]. However, he advised me that he has been having lower back pain.  Reason for Disposition  [1] Pain comes and goes (intermittent) AND [2] present > 24 hours  Answer Assessment - Initial Assessment Questions 1. LOCATION and RADIATION: Where is the pain located?      Pain to scrotum near right side, maybe stretched out the wrong way, may have pulled muscle Fell off of liftgate on truck Broke the wall, landed on right hand, fell over top of the dolly No impact to scrotum 2. QUALITY: What does the pain feel like?  (e.g., sharp, dull, aching, burning)     Don't know if like sciatica bothering, aches from lower back buttock area, aches front area near right part of scrotum, don't know if sciatica affecting that area 3. SEVERITY: How bad is the pain?  (Scale 1-10; or mild, moderate, severe)   - MILD (1-3): doesn't interfere with normal activities    - MODERATE (4-7): interferes with normal activities (e.g., work or school) or awakens from sleep   - SEVERE (8-10): excruciating pain, unable to do any normal activities, difficulty walking     Pretty mild, but want to get checked, early age, concerned about cancer to scrotum Still working 4. ONSET: When did the pain start?     yesterday 5. PATTERN: Does it come and go, or has it  been constant since it started?     When pressure when walk aches a little, no pain to scrotum or back at rest 6. SCROTAL APPEARANCE: What does the scrotum look like? Is there any swelling or redness?      No swelling, no bumps or redness 8. OTHER SYMPTOMS: Do you have any other symptoms? (e.g., fever, abdominal pain, vomiting, difficulty passing urine)     Lower back hx sciatica with injections, ED visits Always had trouble with sciatica before, don't know if getting worse, needs annual physical no fever, no redness or swelling to back/groin/legs, no difficulty passing urine, no vomiting    Advised that pt be examined in next 24 hours, pt looking to establish with [facility], scheduled new patient appt for 9/23, advised pt be seen at [facility] in next 24 hours. Advised  Protocols used: Scrotal Pain-A-AH

## 2023-08-13 ENCOUNTER — Ambulatory Visit: Admitting: Physician Assistant

## 2023-08-13 ENCOUNTER — Encounter: Payer: Self-pay | Admitting: Physician Assistant

## 2023-08-13 VITALS — BP 113/66 | HR 69 | Ht 71.0 in | Wt 148.0 lb

## 2023-08-13 DIAGNOSIS — G8929 Other chronic pain: Secondary | ICD-10-CM | POA: Diagnosis not present

## 2023-08-13 DIAGNOSIS — M5441 Lumbago with sciatica, right side: Secondary | ICD-10-CM | POA: Diagnosis not present

## 2023-08-13 MED ORDER — KETOROLAC TROMETHAMINE 60 MG/2ML IM SOLN
60.0000 mg | Freq: Once | INTRAMUSCULAR | Status: AC
  Administered 2023-08-13: 60 mg via INTRAMUSCULAR

## 2023-08-13 MED ORDER — CYCLOBENZAPRINE HCL 10 MG PO TABS: 10.0000 mg | ORAL_TABLET | Freq: Three times a day (TID) | ORAL | 0 refills | Status: AC | PRN

## 2023-08-13 MED ORDER — METHYLPREDNISOLONE ACETATE 80 MG/ML IJ SUSP
80.0000 mg | Freq: Once | INTRAMUSCULAR | Status: AC
  Administered 2023-08-13: 80 mg via INTRAMUSCULAR

## 2023-08-13 MED ORDER — IBUPROFEN 600 MG PO TABS: 600.0000 mg | ORAL_TABLET | Freq: Three times a day (TID) | ORAL | 0 refills | Status: AC | PRN

## 2023-08-13 NOTE — Patient Instructions (Addendum)
 VISIT SUMMARY:  You came in today due to right-sided back pain following a fall at work. You have been experiencing numbness and tingling down your right leg, similar to previous sciatica episodes. We discussed your concerns about long-term medication use and its potential impact on your ability to drive.  YOUR PLAN:  -RIGHT-SIDED LUMBAR RADICULOPATHY: This condition, also known as sciatica, occurs when a nerve in your lower back is pinched, causing pain, numbness, or tingling down your leg. To help with the inflammation and pain, you received a steroid and pain injection today. You have been prescribed a muscle relaxer to take at bedtime as needed to avoid daytime drowsiness, and ibuprofen  to reduce inflammation. Additionally, you can use ice or heat, or alternate between both, for relief. Once your symptoms improve, start a stretching regimen to help prevent future episodes.  Sciatica  Sciatica is pain, numbness, weakness, or tingling along the path of the sciatic nerve. The sciatic nerve starts in the lower back and runs down the back of each leg. The nerve controls the muscles in the lower leg and in the back of the knee. It also provides feeling (sensation) to the back of the thigh, the lower leg, and the sole of the foot. Sciatica is a symptom of another medical condition that pinches or puts pressure on the sciatic nerve. Sciatica most often only affects one side of the body. Sciatica usually goes away on its own or with treatment. In some cases, sciatica may come back (recur). What are the causes? This condition is caused by pressure on the sciatic nerve or pinching of the nerve. This may be the result of: A disk in between the bones of the spine bulging out too far (herniated disk). Age-related changes in the spinal disks. A pain disorder that affects a muscle in the buttock. Extra bone growth near the sciatic nerve. A break (fracture) of the pelvis. Pregnancy. Tumor. This is rare. What  increases the risk? The following factors may make you more likely to develop this condition: Playing sports that place pressure or stress on the spine. Having poor strength and flexibility. A history of back injury or surgery. Sitting for long periods of time. Doing activities that involve repetitive bending or lifting. Obesity. What are the signs or symptoms? Symptoms can vary from mild to very severe. They may include: Any of the following problems in the lower back, leg, hip, or buttock: Mild tingling, numbness, or dull aches. Burning sensations. Sharp pains. Numbness in the back of the calf or the sole of the foot. Leg weakness. Severe back pain that makes movement difficult. Symptoms may get worse when you cough, sneeze, or laugh, or when you sit or stand for long periods of time. How is this diagnosed? This condition may be diagnosed based on: Your symptoms and medical history. A physical exam. Blood tests. Imaging tests, such as: X-rays. An MRI. A CT scan. How is this treated? In many cases, this condition improves on its own without treatment. However, treatment may include: Reducing or modifying physical activity. Exercising, including strengthening and stretching. Icing and applying heat to the affected area. Medicines that help to: Relieve pain and swelling. Relax your muscles. Injections of medicines that help to relieve pain and inflammation (steroids) around the sciatic nerve. Surgery. Follow these instructions at home: Medicines Take over-the-counter and prescription medicines only as told by your health care provider. Ask your health care provider if the medicine prescribed to you requires you to avoid driving or  using heavy machinery. Managing pain     If directed, put ice on the affected area. To do this: Put ice in a plastic bag. Place a towel between your skin and the bag. Leave the ice on for 20 minutes, 2-3 times a day. If your skin turns bright  red, remove the ice right away to prevent skin damage. The risk of skin damage is higher if you cannot feel pain, heat, or cold. If directed, apply heat to the affected area as often as told by your health care provider. Use the heat source that your health care provider recommends, such as a moist heat pack or a heating pad. Place a towel between your skin and the heat source. Leave the heat on for 20-30 minutes. If your skin turns bright red, remove the heat right away to prevent burns. The risk of burns is higher if you cannot feel pain, heat, or cold. Activity  Return to your normal activities as told by your health care provider. Ask your health care provider what activities are safe for you. Avoid activities that make your symptoms worse. Take brief periods of rest throughout the day. When you rest for longer periods, mix in some mild activity or stretching between periods of rest. This will help to prevent stiffness and pain. Avoid sitting for long periods of time without moving. Get up and move around at least one time each hour. Exercise and stretch regularly as told by your health care provider. Do not lift anything that is heavier than 10 lb (4.5 kg) until your health care provider says that it is safe. When you do not have symptoms, you should still avoid heavy lifting, especially repetitive heavy lifting. When you lift objects, always use proper lifting technique, which includes: Bending your knees. Keeping the load close to your body. Avoiding twisting. General instructions Maintain a healthy weight. Excess weight puts extra stress on your back. Wear supportive, comfortable shoes. Avoid wearing high heels. Avoid sleeping on a mattress that is too soft or too hard. A mattress that is firm enough to support your back when you sleep may help to reduce your pain. Contact a health care provider if: Your pain is not controlled by medicine. Your pain does not improve or gets  worse. Your pain lasts longer than 4 weeks. You have unexplained weight loss. Get help right away if: You are not able to control when you urinate or have bowel movements (incontinence). You have: Weakness in your lower back, pelvis, buttocks, or legs that gets worse. Redness or swelling of your back. A burning sensation when you urinate. Summary Sciatica is pain, numbness, weakness, or tingling along the path of the sciatic nerve, which may include the lower back, legs, hips, and buttocks. This condition is caused by pressure on the sciatic nerve or pinching of the nerve. Treatment often includes rest, exercise, medicines, and applying ice or heat. This information is not intended to replace advice given to you by your health care provider. Make sure you discuss any questions you have with your health care provider. Document Revised: 04/03/2021 Document Reviewed: 04/03/2021 Elsevier Patient Education  2024 [employer].

## 2023-08-13 NOTE — Progress Notes (Unsigned)
 New Patient Office Visit  Subjective    Patient ID: John Mckee, male    DOB: June 18, 1985  Age: 38 y.o. MRN: [id number]  CC:  Chief Complaint  Patient presents with   Back Pain    Right side back pain, Patient states he had numerous strenuous jobs    Discussed the use of AI scribe software for clinical note transcription with the patient, who gave verbal consent to proceed.  History of Present Illness   John Mckee is a 38 year old male with sciatica who presents with right-sided back pain following a fall at work two weeks ago.  He has experienced right-sided back pain for two weeks after falling from a height of five feet at work, landing on his outstretched arm and in a split . He initially suspected a back strain. He currently has numbness and tingling down his right leg, similar to previous sciatica episodes. He uses Tylenol  for pain relief but is concerned about long-term use. He is physically active, recently visiting the gym, and is mindful of his health, including hydration and supplement use. There are no changes in bowel or bladder function.     Outpatient Encounter Medications as of 08/13/2023  Medication Sig   cyclobenzaprine  (FLEXERIL ) 10 MG tablet Take 1 tablet (10 mg total) by mouth 3 (three) times daily as needed for muscle spasms.   ibuprofen  (ADVIL ) 600 MG tablet Take 1 tablet (600 mg total) by mouth every 8 (eight) hours as needed.   albuterol  (VENTOLIN  HFA) 108 (90 Base) MCG/ACT inhaler Inhale 1-2 puffs into the lungs every 6 (six) hours as needed for wheezing or shortness of breath. (Patient not taking: Reported on 08/13/2023)   benzonatate  (TESSALON ) 100 MG capsule Take 1 capsule (100 mg total) by mouth every 8 (eight) hours. (Patient not taking: Reported on 08/13/2023)   cetirizine  (ZYRTEC  ALLERGY) 10 MG tablet Take 1 tablet (10 mg total) by mouth daily. (Patient not taking: Reported on 08/13/2023)   fluticasone  (FLONASE ) 50 MCG/ACT nasal spray Place 2 sprays  into both nostrils daily. (Patient not taking: Reported on 08/13/2023)   [EXPIRED] ketorolac  (TORADOL ) injection 60 mg    [EXPIRED] methylPREDNISolone  acetate (DEPO-MEDROL ) injection 80 mg    No facility-administered encounter medications on file as of 08/13/2023.    Past Medical History:  Diagnosis Date   Asthma     Past Surgical History:  Procedure Laterality Date   APPENDECTOMY      Family History  Problem Relation Age of Onset   Healthy Mother    Healthy Father     Social History   Socioeconomic History   Marital status: Single    Spouse name: Not on file   Number of children: Not on file   Years of education: Not on file   Highest education level: Not on file  Occupational History   Not on file  Tobacco Use   Smoking status: Never   Smokeless tobacco: Never  Vaping Use   Vaping status: Never Used  Substance and Sexual Activity   Alcohol use: Never   Drug use: Never    Types: Marijuana   Sexual activity: Never    Birth control/protection: None  Other Topics Concern   Not on file  Social History Narrative   ** Merged History Encounter **       Social Drivers of Corporate investment banker Strain: Not on file  Food Insecurity: Not on file  Transportation Needs: Not on file  Physical  Activity: Not on file  Stress: Not on file  Social Connections: Not on file  Intimate Partner Violence: Not on file    Review of Systems  Constitutional: Negative.   HENT: Negative.    Eyes: Negative.   Respiratory:  Negative for shortness of breath.   Cardiovascular:  Negative for chest pain.  Gastrointestinal: Negative.   Genitourinary:  Negative for dysuria.  Musculoskeletal:  Positive for back pain and myalgias.  Skin: Negative.   Neurological: Negative.   Endo/Heme/Allergies: Negative.   Psychiatric/Behavioral: Negative.          Objective    BP 113/66 (BP Location: Left Arm, Patient Position: Sitting, Cuff Size: Large)   Pulse 69   Ht 5' 11 (1.803 m)    Wt 148 lb (67.1 kg)   SpO2 98%   BMI 20.64 kg/m   Physical Exam Vitals and nursing note reviewed.  Constitutional:      Appearance: Normal appearance.  HENT:     Head: Normocephalic and atraumatic.     Right Ear: External ear normal.     Left Ear: External ear normal.     Nose: Nose normal.     Mouth/Throat:     Mouth: Mucous membranes are moist.     Pharynx: Oropharynx is clear.  Eyes:     Extraocular Movements: Extraocular movements intact.     Conjunctiva/sclera: Conjunctivae normal.     Pupils: Pupils are equal, round, and reactive to light.  Cardiovascular:     Rate and Rhythm: Normal rate and regular rhythm.     Pulses: Normal pulses.     Heart sounds: Normal heart sounds.  Pulmonary:     Effort: Pulmonary effort is normal.     Breath sounds: Normal breath sounds.  Musculoskeletal:     Cervical back: Normal, normal range of motion and neck supple.     Thoracic back: Normal.     Lumbar back: No tenderness. Decreased range of motion.     Comments: Pain elicited with ROM testing.  Skin:    General: Skin is warm and dry.  Neurological:     General: No focal deficit present.     Mental Status: He is alert and oriented to person, place, and time.  Psychiatric:        Mood and Affect: Mood normal.        Behavior: Behavior normal.        Thought Content: Thought content normal.        Judgment: Judgment normal.         Assessment & Plan:   Problem List Items Addressed This Visit   None Visit Diagnoses       Acute right-sided low back pain with right-sided sciatica    -  Primary   Relevant Medications   methylPREDNISolone  acetate (DEPO-MEDROL ) injection 80 mg (Completed)   ketorolac  (TORADOL ) injection 60 mg (Completed)   cyclobenzaprine  (FLEXERIL ) 10 MG tablet   ibuprofen  (ADVIL ) 600 MG tablet       Assessment and Plan Right-sided lumbar radiculopathy Chronic right-sided lumbar radiculopathy exacerbated by recent fall. Symptoms consistent with  sciatica. Concern about medication side effects affecting driving. - Administered steroid and pain injection for inflammation and pain relief. - Prescribed muscle relaxer at bedtime as needed to avoid daytime drowsiness. - Prescribed ibuprofen  for anti-inflammatory effects. - Advised use of ice or heat, or alternating both, for symptomatic relief. - Recommended stretching regimen once symptoms improve to prevent future episodes. Red flags given for prompt reevaluation.  I have reviewed the patient's medical history (PMH, PSH, Social History, Family History, Medications, and allergies) , and have been updated if relevant. I spent 30 minutes reviewing chart and  face to face time with patient.    Return if symptoms worsen or fail to improve.   Kirk RAMAN Mayers, PA-C

## 2023-08-14 ENCOUNTER — Encounter: Payer: Self-pay | Admitting: Physician Assistant

## 2023-09-27 ENCOUNTER — Telehealth (INDEPENDENT_AMBULATORY_CARE_PROVIDER_SITE_OTHER): Payer: Self-pay | Admitting: *Deleted

## 2023-09-27 NOTE — Telephone Encounter (Signed)
 Contacted pt phone number not working dial tone, couldn't leave a message to resch appt pcp not in the office til oct

## 2023-10-01 ENCOUNTER — Ambulatory Visit (INDEPENDENT_AMBULATORY_CARE_PROVIDER_SITE_OTHER): Admitting: Primary Care

## 2023-11-30 ENCOUNTER — Encounter (HOSPITAL_BASED_OUTPATIENT_CLINIC_OR_DEPARTMENT_OTHER): Payer: Self-pay | Admitting: Emergency Medicine

## 2023-11-30 ENCOUNTER — Emergency Department (HOSPITAL_BASED_OUTPATIENT_CLINIC_OR_DEPARTMENT_OTHER)
Admission: EM | Admit: 2023-11-30 | Discharge: 2023-11-30 | Disposition: A | Discharge status: Home / Self Care | Attending: Emergency Medicine | Admitting: Emergency Medicine

## 2023-11-30 ENCOUNTER — Emergency Department (HOSPITAL_BASED_OUTPATIENT_CLINIC_OR_DEPARTMENT_OTHER)

## 2023-11-30 DIAGNOSIS — M545 Low back pain, unspecified: Secondary | ICD-10-CM | POA: Diagnosis present

## 2023-11-30 DIAGNOSIS — M5442 Lumbago with sciatica, left side: Secondary | ICD-10-CM | POA: Insufficient documentation

## 2023-11-30 DIAGNOSIS — M5432 Sciatica, left side: Secondary | ICD-10-CM

## 2023-11-30 DIAGNOSIS — M549 Dorsalgia, unspecified: Secondary | ICD-10-CM | POA: Diagnosis not present

## 2023-11-30 MED ORDER — KETOROLAC TROMETHAMINE 60 MG/2ML IM SOLN
60.0000 mg | Freq: Once | INTRAMUSCULAR | Status: AC
  Administered 2023-11-30: 60 mg via INTRAMUSCULAR
  Filled 2023-11-30: qty 2

## 2023-11-30 MED ORDER — METHYLPREDNISOLONE SODIUM SUCC 125 MG IJ SOLR
125.0000 mg | Freq: Once | INTRAMUSCULAR | Status: AC
  Administered 2023-11-30: 125 mg via INTRAMUSCULAR
  Filled 2023-11-30: qty 2

## 2023-11-30 NOTE — ED Notes (Signed)
 Patient transported to X-ray

## 2023-11-30 NOTE — Discharge Instructions (Signed)
 Your x-rays of your back are normal today.  You received Toradol  and steroid shot today  Continue taking ibuprofen  and Flexeril  as prescribed by your doctor  I have referred you to neurosurgery for follow-up.  If you do not hear from them next week, please call office number for appointment  Return to ER if you have worse back pain or pain down your legs or trouble walking or weakness

## 2023-11-30 NOTE — ED Provider Notes (Signed)
 [facility] Provider Note   CSN: [id number] Arrival date & time: 11/30/23  1807     Patient presents with: Back Pain   [name] is a 38 y.o. male history of sciatica here presenting with low back pain.  Patient states that he did a lot of heavy lifting his last job and had sciatica.  He states that he came to urgent care and had steroid and Toradol  shot back in August.  He states that for the last several days it got worse.  Patient denies any weakness or incontinence.  Patient wants to get some x-rays to make sure everything is okay.   The history is provided by the patient.       Prior to Admission medications   Medication Sig Start Date End Date Taking? Authorizing Provider  albuterol  (VENTOLIN  HFA) 108 (90 Base) MCG/ACT inhaler Inhale 1-2 puffs into the lungs every 6 (six) hours as needed for wheezing or shortness of breath. Patient not taking: Reported on 08/13/2023 10/29/20   Long, Fonda MATSU, MD  benzonatate  (TESSALON ) 100 MG capsule Take 1 capsule (100 mg total) by mouth every 8 (eight) hours. Patient not taking: Reported on 08/13/2023 11/23/22   Henderly, Britni A, PA-C  cetirizine  (ZYRTEC  ALLERGY) 10 MG tablet Take 1 tablet (10 mg total) by mouth daily. Patient not taking: Reported on 08/13/2023 11/23/22   Henderly, Britni A, PA-C  cyclobenzaprine  (FLEXERIL ) 10 MG tablet Take 1 tablet (10 mg total) by mouth 3 (three) times daily as needed for muscle spasms. 08/13/23   Mayers, Cari S, PA-C  fluticasone  (FLONASE ) 50 MCG/ACT nasal spray Place 2 sprays into both nostrils daily. Patient not taking: Reported on 08/13/2023 11/23/22   Henderly, Britni A, PA-C  ibuprofen  (ADVIL ) 600 MG tablet Take 1 tablet (600 mg total) by mouth every 8 (eight) hours as needed. 08/13/23   Mayers, Cari S, PA-C    Allergies: Patient has no known allergies.    Review of Systems  Musculoskeletal:  Positive for back pain.  All other systems reviewed and are  negative.   Updated Vital Signs BP 137/77 (BP Location: Left Arm)   Pulse 73   Temp 98.3 F (36.8 C) (Oral)   Resp 15   Ht 5' 11 (1.803 m)   Wt 68 kg   SpO2 100%   BMI 20.92 kg/m   Physical Exam Vitals and nursing note reviewed.  HENT:     Head: Normocephalic.     Nose: Nose normal.     Mouth/Throat:     Mouth: Mucous membranes are moist.  Eyes:     Extraocular Movements: Extraocular movements intact.     Pupils: Pupils are equal, round, and reactive to light.  Cardiovascular:     Rate and Rhythm: Normal rate.     Pulses: Normal pulses.     Heart sounds: Normal heart sounds.  Pulmonary:     Effort: Pulmonary effort is normal.     Breath sounds: Normal breath sounds.  Abdominal:     General: Abdomen is flat.     Palpations: Abdomen is soft.  Musculoskeletal:     Cervical back: Normal range of motion and neck supple.     Comments: Tenderness to the left SI joint versus para lumbar area.  Patient has mildly positive left straight leg raise.  Patient has no saddle anesthesia.  Patient has normal reflexes bilateral knees.  Patient has normal gait.  Skin:    General: Skin  is warm.     Capillary Refill: Capillary refill takes less than 2 seconds.  Neurological:     Mental Status: He is alert.     Comments: Patient has normal gait.  No saddle anesthesia.  Normal reflexes bilateral knees  Psychiatric:        Mood and Affect: Mood normal.        Behavior: Behavior normal.     (all labs ordered are listed, but only abnormal results are displayed) Labs Reviewed - No data to display  EKG: None  Radiology: DG Lumbar Spine Complete Result Date: 11/30/2023 EXAM: 4 VIEW(S) XRAY OF THE LUMBAR SPINE 11/30/2023 07:29:00 PM COMPARISON: 10/29/2018. CLINICAL HISTORY: back pain back pain back pain FINDINGS: LUMBAR SPINE: BONES: 5 lumbar-type vertebral bodies. Normal alignment of the lumbar spine and facet joints. No vertebral compression deformities. No bone encroachment upon the  neural foramina. The visualized sacrum appears intact. No acute fracture. No aggressive appearing osseous lesion. DISCS AND DEGENERATIVE CHANGES: Intervertebral disc space heights are normal. No severe degenerative changes. SOFT TISSUES: No acute abnormality. IMPRESSION: 1. No acute abnormality of the lumbar spine. Electronically signed by: Elsie Gravely MD 11/30/2023 07:52 PM EST RP Workstation: [id number]     Procedures   Medications Ordered in the ED  ketorolac  (TORADOL ) injection 60 mg (60 mg Intramuscular Given 11/30/23 1915)  methylPREDNISolone  sodium succinate (SOLU-MEDROL ) 125 mg/2 mL injection 125 mg (125 mg Intramuscular Given 11/30/23 1916)                                    Medical Decision Making John Mckee is a 38 y.o. male here presenting with sciatica.  X-ray did not show any fracture.  Given Toradol  and Solu-Medrol .  Patient already has muscle relaxant at home.  Patient requests referral to neurosurgery outpatient.   Problems Addressed: Sciatica of left side: acute illness or injury  Amount and/or Complexity of Data Reviewed Radiology: ordered.  Risk Prescription drug management.     Final diagnoses:  Sciatica of left side    ED Discharge Orders          Ordered    Ambulatory referral to Neurosurgery       Comments: Please Select To Department: [facility] for Nerve or Spine  Please select To Department: [facility] [place] for Cranial or Neurovascular   11/30/23 2010               [name], MD 11/30/23 2014

## 2023-11-30 NOTE — ED Triage Notes (Signed)
 Pt c/o lower back pain for some time; no injury, but does a lot of heavy lifting
